# Patient Record
Sex: Male | Born: 1975 | Race: Asian | Hispanic: No | Marital: Married
Health system: Midwestern US, Academic
[De-identification: ages and names within clinical notes are randomized; demographics above are authoritative.]

## PROBLEM LIST (undated history)

## (undated) DIAGNOSIS — G90522 Complex regional pain syndrome I of left lower limb: Secondary | ICD-10-CM

## (undated) DIAGNOSIS — E119 Type 2 diabetes mellitus without complications: Secondary | ICD-10-CM

## (undated) DIAGNOSIS — Z9889 Other specified postprocedural states: Secondary | ICD-10-CM

## (undated) DIAGNOSIS — E785 Hyperlipidemia, unspecified: Secondary | ICD-10-CM

## (undated) DIAGNOSIS — R7303 Prediabetes: Secondary | ICD-10-CM

## (undated) HISTORY — DX: Complex regional pain syndrome i of left lower limb: G90.522

## (undated) HISTORY — DX: Prediabetes: R73.03

## (undated) HISTORY — PX: LEG SURGERY: SHX1003

## (undated) HISTORY — DX: Type 2 diabetes mellitus without complications: E11.9

## (undated) HISTORY — DX: Hyperlipidemia, unspecified: E78.5

## (undated) HISTORY — DX: Other specified postprocedural states: Z98.890

## (undated) HISTORY — PX: ORIF ANKLE FRACTURE: SUR919

---

## 2009-09-29 ENCOUNTER — Inpatient Hospital Stay

## 2012-10-28 DIAGNOSIS — Z9889 Other specified postprocedural states: Secondary | ICD-10-CM

## 2012-10-28 HISTORY — DX: Other specified postprocedural states: Z98.890

## 2014-05-12 ENCOUNTER — Emergency Department: Payer: PRIVATE HEALTH INSURANCE

## 2014-05-12 ENCOUNTER — Inpatient Hospital Stay: Admit: 2014-05-12 | Payer: PRIVATE HEALTH INSURANCE

## 2014-05-12 ENCOUNTER — Emergency Department: Admit: 2014-05-12 | Payer: PRIVATE HEALTH INSURANCE

## 2014-05-12 ENCOUNTER — Inpatient Hospital Stay: Admit: 2014-05-12

## 2014-05-12 ENCOUNTER — Inpatient Hospital Stay
Admission: EM | Admit: 2014-05-12 | Discharge: 2014-05-14 | Disposition: A | Discharge status: Home / Self Care | Payer: PRIVATE HEALTH INSURANCE

## 2014-05-12 DIAGNOSIS — S82899B Other fracture of unspecified lower leg, initial encounter for open fracture type I or II: Secondary | ICD-10-CM

## 2014-05-12 LAB — VENOUS BLOOD GAS, LINE/SYRINGE
%HBO2-Line Draw: 79.9 % — ABNORMAL HIGH (ref 40.0–70.0)
Base Excess-Line Draw: 2.3 mmol/L (ref <=3.0)
CO2 Content-Line Draw: 29 mmol/L (ref 25–29)
Carboxyhgb-Line Draw: 4.1 % — ABNORMAL HIGH (ref 0.0–2.0)
HCO3-Line Draw: 28 mmol/L (ref 24–28)
Methemoglobin-Line Draw: 0.8 % (ref 0.0–1.5)
PCO2-Line Draw: 48 mm[Hg] (ref 41–51)
PH-Line Draw: 7.38 (ref 7.32–7.42)
PO2-Line Draw: 46 mm[Hg] — ABNORMAL HIGH (ref 25–40)
Reduced Hemoglobin-Line Draw: 15.2 % — ABNORMAL HIGH (ref 0.0–5.0)

## 2014-05-12 LAB — BASIC METABOLIC PANEL
Anion Gap: 4 mmol/L (ref 3–16)
BUN: 14 mg/dL (ref 7–25)
CO2: 30 mmol/L (ref 21–33)
Calcium: 9.2 mg/dL (ref 8.6–10.3)
Chloride: 104 mmol/L
Creatinine: 1.2 mg/dL (ref 0.60–1.30)
Glucose: 140 mg/dL — ABNORMAL HIGH (ref 70–100)
Osmolality, Calculated: 289 mosm/kg (ref 278–305)
Potassium: 3.9 mmol/L (ref 3.5–5.3)
Sodium: 138 mmol/L (ref 133–146)

## 2014-05-12 LAB — CBC
Hematocrit: 44.2 %
Hemoglobin: 15.3 g/dL
MCH: 30.8 pg
MCHC: 34.6 g/dL
MCV: 89.1 fL
MPV: 7.5 fL
Platelets: 255 10*3/uL
RBC: 4.97 10*6/uL
RDW: 12.3 %
WBC: 9.1 10*3/uL

## 2014-05-12 LAB — PROTIME-INR
INR: 1 (ref 0.9–1.1)
Protime: 12.9 s (ref 11.6–14.4)

## 2014-05-12 LAB — ABO/RH: Rh Type: POSITIVE

## 2014-05-12 LAB — ANTIBODY SCREEN: Antibody Screen: NEGATIVE

## 2014-05-12 LAB — LACTIC ACID: Lactate: 1.7 mmol/L (ref 0.5–2.2)

## 2014-05-12 LAB — ETHANOL, SERUM: Ethanol: <10 mg/dL (ref 0–10)

## 2014-05-12 MED ORDER — famotidine (PF) (PEPCID) 20 mg/2 mL Soln
20 | INTRAVENOUS | Status: AC | PRN
Start: 2014-05-12 — End: 2014-05-12
  Administered 2014-05-12: 21:00:00 20 via INTRAVENOUS

## 2014-05-12 MED ORDER — succinylcholine (QUELICIN) injection
20 | INTRAMUSCULAR | Status: AC | PRN
Start: 2014-05-12 — End: 2014-05-12
  Administered 2014-05-12: 22:00:00 100 via INTRAVENOUS

## 2014-05-12 MED ORDER — promethazine (PHENERGAN) injection 6.25 mg
25 | Freq: Four times a day (QID) | INTRAMUSCULAR | Status: AC | PRN
Start: 2014-05-12 — End: 2014-05-12

## 2014-05-12 MED ORDER — fentaNYL (SUBLIMAZE) injection 25 mcg
50 | INTRAMUSCULAR | Status: AC | PRN
Start: 2014-05-12 — End: 2014-05-12

## 2014-05-12 MED ORDER — oxyCODONE (ROXICODONE) immediate release tablet 15 mg
15 | ORAL | Status: AC | PRN
Start: 2014-05-12 — End: 2014-05-13
  Administered 2014-05-13 (×3): 15 mg via ORAL

## 2014-05-12 MED ORDER — ceFAZolin (ANCEF) injection
1 | INTRAMUSCULAR | Status: AC
Start: 2014-05-12 — End: ?

## 2014-05-12 MED ORDER — nalOXone (NARCAN) injection 0.04 mg
0.4 | INTRAMUSCULAR | Status: AC | PRN
Start: 2014-05-12 — End: 2014-05-12

## 2014-05-12 MED ORDER — dexamethasone (DECADRON) injection
4 | INTRAMUSCULAR | Status: AC | PRN
Start: 2014-05-12 — End: 2014-05-12
  Administered 2014-05-12: 10 via INTRAVENOUS

## 2014-05-12 MED ORDER — sodium chloride, irrigation 0.9 % irrigation
0.9 | Status: AC | PRN
Start: 2014-05-12 — End: 2014-05-12
  Administered 2014-05-12: 22:00:00 3000
  Administered 2014-05-12: 22:00:00 1000

## 2014-05-12 MED ORDER — lactated ringers infusion
INTRAVENOUS | Status: AC
Start: 2014-05-12 — End: ?

## 2014-05-12 MED ORDER — bacitracin 50,000 Units in sodium chloride, irrigation 0.9 % 3,000 mL IRRIGATION
50000 | INTRAMUSCULAR | Status: AC | PRN
Start: 2014-05-12 — End: 2014-05-12
  Administered 2014-05-12: 22:00:00 50000 [IU]

## 2014-05-12 MED ORDER — oxyCODONE (ROXICODONE) immediate release tablet 10 mg
5 | ORAL | Status: AC | PRN
Start: 2014-05-12 — End: 2014-05-13

## 2014-05-12 MED ORDER — fentaNYL (SUBLIMAZE) 50 mcg/mL injection
50 | INTRAMUSCULAR | Status: AC
Start: 2014-05-12 — End: ?

## 2014-05-12 MED ORDER — HYDROmorphone (DILAUDID) injection Syrg 0.6 mg
1 | INTRAMUSCULAR | Status: AC | PRN
Start: 2014-05-12 — End: 2014-05-12
  Administered 2014-05-13: 01:00:00 0.6 mg via INTRAVENOUS

## 2014-05-12 MED ORDER — lactated ringers infusion
INTRAVENOUS | Status: AC
Start: 2014-05-12 — End: 2014-05-13

## 2014-05-12 MED ORDER — acetaminophen (OFIRMEV) 1,000 mg/100 mL (10 mg/mL) Soln
1000 | INTRAVENOUS | Status: AC
Start: 2014-05-12 — End: ?

## 2014-05-12 MED ORDER — ondansetron (ZOFRAN) 4 mg/2 mL injection
4 | INTRAMUSCULAR | Status: AC | PRN
Start: 2014-05-12 — End: 2014-05-12
  Administered 2014-05-12: 4 via INTRAVENOUS

## 2014-05-12 MED ORDER — HYDROmorphone (DILAUDID) 2 mg/mL injection Syrg
2 | INTRAMUSCULAR | Status: AC
Start: 2014-05-12 — End: ?

## 2014-05-12 MED ORDER — ketorolac (TORADOL) injection 30 mg
30 | INTRAMUSCULAR | Status: AC | PRN
Start: 2014-05-12 — End: 2014-05-12
  Administered 2014-05-12: 30 via INTRAVENOUS

## 2014-05-12 MED ORDER — enoxaparin (LOVENOX) syringe 30 mg/0.3 mL
30 | Freq: Two times a day (BID) | SUBCUTANEOUS | Status: AC
Start: 2014-05-12 — End: 2014-05-14
  Administered 2014-05-13 – 2014-05-14 (×4): 30 mg via SUBCUTANEOUS

## 2014-05-12 MED ORDER — HYDROmorphone (DILAUDID) injection Syrg
2 | INTRAMUSCULAR | Status: AC | PRN
Start: 2014-05-12 — End: 2014-05-12
  Administered 2014-05-12: 23:00:00 1 via INTRAVENOUS
  Administered 2014-05-12 (×2): .5 via INTRAVENOUS

## 2014-05-12 MED ORDER — acetaminophenTYLENOLtablet975mg
325 | Freq: Three times a day (TID) | ORAL | Status: AC
Start: 2014-05-12 — End: 2014-05-13
  Administered 2014-05-13 (×2): 975 mg via ORAL

## 2014-05-12 MED ORDER — famotidine (PF) (PEPCID) 20 mg/2 mL Soln
20 | INTRAVENOUS | Status: AC
Start: 2014-05-12 — End: ?

## 2014-05-12 MED ORDER — fentaNYL (SUBLIMAZE) injection 50 mcg
50 | Freq: Once | INTRAMUSCULAR | Status: AC
Start: 2014-05-12 — End: 2014-05-14

## 2014-05-12 MED ORDER — ceFAZolin (ANCEF) IVPB 1 g in D5W (duplex)
1 | Freq: Once | INTRAVENOUS | Status: AC
Start: 2014-05-12 — End: 2014-05-12
  Administered 2014-05-12: 22:00:00 1 g via INTRAVENOUS

## 2014-05-12 MED ORDER — ondansetron (ZOFRAN) 4 mg/2 mL injection 4 mg
4 | Freq: Three times a day (TID) | INTRAMUSCULAR | Status: AC | PRN
Start: 2014-05-12 — End: 2014-05-12

## 2014-05-12 MED ORDER — ondansetron (ZOFRAN) 4 mg/2 mL injection 4 mg
4 | Freq: Four times a day (QID) | INTRAMUSCULAR | Status: AC | PRN
Start: 2014-05-12 — End: 2014-05-14
  Administered 2014-05-13: 04:00:00 4 mg via INTRAVENOUS

## 2014-05-12 MED ORDER — Diphth Pertus AC Tetanus Vaccine
2 | Freq: Once | INTRAMUSCULAR | Status: AC
Start: 2014-05-12 — End: 2014-05-14

## 2014-05-12 MED ORDER — HYDROmorphone (DILAUDID) 1 mg/mL injection Syrg
1 | INTRAMUSCULAR | Status: AC
Start: 2014-05-12 — End: 2014-05-13

## 2014-05-12 MED ORDER — oxyCODONE (ROXICODONE) immediate release tablet 5 mg
5 | ORAL | Status: AC | PRN
Start: 2014-05-12 — End: 2014-05-13

## 2014-05-12 MED ORDER — ceFAZolin (ANCEF) 1,000 mg in sodium chloride 100 mL IVPB
1 | Freq: Once | INTRAVENOUS | Status: AC
Start: 2014-05-12 — End: 2014-05-12

## 2014-05-12 MED ORDER — lidocaine (PF) 20 mg/mL (2 %) Soln
20 | INTRAVENOUS | Status: AC | PRN
Start: 2014-05-12 — End: 2014-05-12
  Administered 2014-05-12: 21:00:00 60 via INTRAVENOUS

## 2014-05-12 MED ORDER — fentaNYL (SUBLIMAZE) injection 12.5 mcg
50 | INTRAMUSCULAR | Status: AC | PRN
Start: 2014-05-12 — End: 2014-05-12

## 2014-05-12 MED ORDER — bacitracin 50,000 unit injection
50000 | INTRAMUSCULAR | Status: AC
Start: 2014-05-12 — End: 2014-05-13

## 2014-05-12 MED ORDER — HYDROmorphone (DILAUDID) injection Syrg 1 mg
1 | INTRAMUSCULAR | Status: AC | PRN
Start: 2014-05-12 — End: 2014-05-14
  Administered 2014-05-13 – 2014-05-14 (×5): 1 mg via INTRAVENOUS

## 2014-05-12 MED ORDER — propofol10mgmlDIPRIVANinjection
10 | INTRAVENOUS | Status: AC | PRN
Start: 2014-05-12 — End: 2014-05-12
  Administered 2014-05-12: 22:00:00 200 via INTRAVENOUS

## 2014-05-12 MED ORDER — acetaminophen (OFIRMEV) Soln
1000 | INTRAVENOUS | Status: AC | PRN
Start: 2014-05-12 — End: 2014-05-12
  Administered 2014-05-12: 1000 via INTRAVENOUS

## 2014-05-12 MED ORDER — HYDROmorphone (DILAUDID) injection Syrg 0.4 mg
0.5 | INTRAMUSCULAR | Status: AC | PRN
Start: 2014-05-12 — End: 2014-05-12
  Administered 2014-05-13: 01:00:00 0.4 mg via INTRAVENOUS
  Administered 2014-05-13: 01:00:00 0.5 mg via INTRAVENOUS

## 2014-05-12 MED ORDER — lactated ringers infusion
INTRAVENOUS | Status: AC
Start: 2014-05-12 — End: 2014-05-13
  Administered 2014-05-12 (×2): via INTRAVENOUS
  Administered 2014-05-13: 01:00:00 125 mL/h via INTRAVENOUS

## 2014-05-12 MED ORDER — fentaNYL (SUBLIMAZE) 50 mcg/mL injection
50 | INTRAMUSCULAR | Status: AC
Start: 2014-05-12 — End: 2014-05-13

## 2014-05-12 MED ORDER — fentaNYL (SUBLIMAZE) injection 50 mcg
50 | INTRAMUSCULAR | Status: AC | PRN
Start: 2014-05-12 — End: 2014-05-12

## 2014-05-12 MED ORDER — senna-docusate (SENNA-S) 8.6-50 mg per tablet 1 tablet
8.6-50 | Freq: Two times a day (BID) | ORAL | Status: AC
Start: 2014-05-12 — End: 2014-05-14
  Administered 2014-05-13 – 2014-05-14 (×4): 1 via ORAL

## 2014-05-12 MED ORDER — HYDROmorphone (DILAUDID) injection Syrg 0.2 mg
0.5 | INTRAMUSCULAR | Status: AC | PRN
Start: 2014-05-12 — End: 2014-05-12

## 2014-05-12 MED ORDER — lidocaine (PF) 20 mg/mL (2 %) Soln
20 | INTRAVENOUS | Status: AC
Start: 2014-05-12 — End: ?

## 2014-05-12 MED ORDER — fentaNYL (SUBLIMAZE) injection
50 | INTRAMUSCULAR | Status: AC | PRN
Start: 2014-05-12 — End: 2014-05-12
  Administered 2014-05-12: 22:00:00 50 via INTRAVENOUS
  Administered 2014-05-12: 21:00:00 25 via INTRAVENOUS
  Administered 2014-05-12 (×2): 50 via INTRAVENOUS
  Administered 2014-05-12: 21:00:00 25 via INTRAVENOUS
  Administered 2014-05-12: 22:00:00 50 via INTRAVENOUS

## 2014-05-12 MED ORDER — lactated ringers infusion
INTRAVENOUS | Status: AC
Start: 2014-05-12 — End: 2014-05-13
  Administered 2014-05-13: 09:00:00 75 mL/h via INTRAVENOUS

## 2014-05-12 MED ORDER — HYDROmorphone (DILAUDID) injection Syrg 0.5 mg
0.5 | INTRAMUSCULAR | Status: AC | PRN
Start: 2014-05-12 — End: 2014-05-14

## 2014-05-12 MED FILL — LOVENOX 30 MG/0.3 ML SUBCUTANEOUS SYRINGE: 30 30 mg/0.3 mL | SUBCUTANEOUS | Qty: 0.3

## 2014-05-12 MED FILL — HYDROMORPHONE 1 MG/ML INJECTION SYRINGE: 1 1 mg/mL | INTRAMUSCULAR | Qty: 1

## 2014-05-12 MED FILL — LACTATED RINGERS INTRAVENOUS SOLUTION: 125.00 125.00 mL/hr | INTRAVENOUS | Qty: 1000

## 2014-05-12 MED FILL — CEFAZOLIN 1 GRAM SOLUTION FOR INJECTION: 1 1 g | INTRAMUSCULAR | Qty: 1000

## 2014-05-12 MED FILL — HYDROMORPHONE 2 MG/ML INJECTION SYRINGE: 2 2 mg/mL | INTRAMUSCULAR | Qty: 1

## 2014-05-12 MED FILL — ADACEL (TDAP ADOLESN/ADULT)(PF)2 LF-(2.5-5-3-5)-5 LF/0.5 ML IM SYRINGE: 2 2 Lf-(2.5-5-3-5 mcg)-5Lf/0.5 mL | INTRAMUSCULAR | Qty: 0.5

## 2014-05-12 MED FILL — HYDROMORPHONE 0.5 MG/0.5 ML INJECTION SYRINGE: 0.5 0.5 mg/0.5 mL | INTRAMUSCULAR | Qty: 0.5

## 2014-05-12 MED FILL — FAMOTIDINE (PF) 20 MG/2 ML INTRAVENOUS SOLUTION: 20 20 mg/2 mL | INTRAVENOUS | Qty: 2

## 2014-05-12 MED FILL — LIDOCAINE (PF) 20 MG/ML (2 %) INTRAVENOUS SOLUTION: 20 20 mg/mL (2 %) | INTRAVENOUS | Qty: 5

## 2014-05-12 MED FILL — OXYCODONE 15 MG TABLET: 15 15 MG | ORAL | Qty: 1

## 2014-05-12 MED FILL — OFIRMEV 1,000 MG/100 ML (10 MG/ML) INTRAVENOUS SOLUTION: 1000 1,000 mg/100 mL (10 mg/mL) | INTRAVENOUS | Qty: 100

## 2014-05-12 MED FILL — ENOXAPARIN 30 MG/0.3 ML SUBCUTANEOUS SYRINGE: 30 30 mg/0.3 mL | SUBCUTANEOUS | Qty: 0.3

## 2014-05-12 MED FILL — SENNA-S 8.6 MG-50 MG TABLET: 8.6-50 8.6-50 mg | ORAL | Qty: 1

## 2014-05-12 MED FILL — TYLENOL 325 MG TABLET: 325 325 mg | ORAL | Qty: 3

## 2014-05-12 MED FILL — FENTANYL (PF) 50 MCG/ML INJECTION SOLUTION: 50 50 mcg/mL | INTRAMUSCULAR | Qty: 5

## 2014-05-12 MED FILL — LACTATED RINGERS INTRAVENOUS SOLUTION: INTRAVENOUS | Qty: 1000

## 2014-05-12 MED FILL — ONDANSETRON HCL (PF) 4 MG/2 ML INJECTION SOLUTION: 4 4 mg/2 mL | INTRAMUSCULAR | Qty: 2

## 2014-05-12 MED FILL — FENTANYL (PF) 50 MCG/ML INJECTION SOLUTION: 50 50 mcg/mL | INTRAMUSCULAR | Qty: 2

## 2014-05-12 MED FILL — BACITRACIN 50,000 UNIT INTRAMUSCULAR SOLUTION: 50000 50,000 unit | INTRAMUSCULAR | Qty: 1

## 2014-05-12 MED FILL — CEFAZOLIN 1 GRAM/50 ML IN DEXTROSE (ISO-OSMOTIC) INTRAVENOUS PIGGYBACK: 1 1 gram/50 mL | INTRAVENOUS | Qty: 50

## 2014-05-12 NOTE — Unmapped (Signed)
ANESTHESIOLOGY PRE-PROCEDURAL EVALUATION    Austin Williams is a 38 y.o. year old [place] presenting for:    Procedure(s):  IM NAILING TIBIA STYKER    Surgeon:   Gayla Medicus, MD    Anesthesia Evaluation         No history of anesthetic complications   I have reviewed the History and Physical Exam, any relevant changes are noted in the anesthesia pre-operative evaluation.    Medical History / Review of Systems:    Cardiovascular:    Exercise tolerance: good  Duke Met score: 4 - Raking leaves. Weeding or pushing a power mower.  (-) pacemaker, hypertension, valvular problems/murmurs, past MI, CAD, cardiomyopathy, CABG/stent, dysrhythmias, angina, CHF.    Neuro/Muscoloskeletal/Psych:    (+) neuromuscular disease.    (-) seizures, TIA, CVA, headaches, psychiatric history, arthritis, back problems.     Pulmonary:      (-) no pneumonia, COPD, asthma, recent URI, sleep apnea.       GI/Hepatic/Renal:    GERD is well controlled.    (-) hepatitis, liver disease, renal disease, no end stage liver disease.    Endo/Other:        (-) diabetes mellitus, hypothyroidism, hyperthyroidism, no bleeding disorder. No opioid use      PAST MEDICAL HISTORY:  History reviewed. No pertinent past medical history.    PAST SURGICAL HISTORY:  History reviewed. No pertinent past surgical history.    FAMILY HISTORY:  No family history on file.    SOCIAL HISTORY:  History     Social History   ??? Marital Status: Married     Spouse Name: N/A     Number of Children: N/A   ??? Years of Education: N/A     Occupational History   ??? Not on file.     Social History Main Topics   ??? Smoking status: Current Every Day Smoker     Types: Cigarettes   ??? Smokeless tobacco: Not on file   ??? Alcohol Use: No   ??? Drug Use: No   ??? Sexual Activity: Not on file     Other Topics Concern   ??? Not on file     Social History Narrative   ??? No narrative on file       MEDICATIONS:  No current facility-administered medications on file prior to encounter.     No current  outpatient prescriptions on file prior to encounter.       ALLERGIES:  No Known Allergies    VITAL SIGNS:  Wt Readings from Last 3 Encounters:   No data found for Wt     Ht Readings from Last 3 Encounters:   No data found for Ht     Temp Readings from Last 3 Encounters:   No data found for Temp     BP Readings from Last 3 Encounters:   No data found for BP     Pulse Readings from Last 3 Encounters:   No data found for Pulse     SpO2 Readings from Last 3 Encounters:   No data found for SpO2       Physical Exam:    Airway:     Mallampati: II  Mouth Opening: >2 FB  TM distance: > = 3 FB  Neck ROM: full  (-) no facial hair, neck not short      Dental:        Comment: Dentition otherwise ok      Pulmonary:   - normal  exam    Breath sounds clear to auscultation.       Cardiovascular:  - normal exam   Rhythm: regular  Rate: normal    Neuro/Musculoskeletal/Psych:    Mental status: alert and oriented to person, place and time.          Abdominal:     Not obese.  Abdomen: soft.    Current OB Status:       Other Findings:        LAB RESULTS:  Lab Results   Component Value Date    WBC 9.1 05/12/2014    HGB 15.3 05/12/2014    HCT 44.2 05/12/2014    MCV 89.1 05/12/2014    PLT 255 05/12/2014       No results found for this basename: ABORH       Lab Results   Component Value Date    GLUCOSE 140* 05/12/2014    BUN 14 05/12/2014    CO2 30 05/12/2014    CREATININE 1.20 05/12/2014    K 3.9 05/12/2014    NA 138 05/12/2014    CL 104 05/12/2014    CALCIUM 9.2 05/12/2014       Lab Results   Component Value Date    INR 1.0 05/12/2014       No results found for this basename: PREGTESTUR, PREGSERUM, HCG, HCGQUANT       Anesthesia Plan:    ASA 1 - emergency     Anesthesia Type:  general endotracheal.     Intravenous and rapid sequence induction.    Anesthetic plan and risks discussed with patient.    Plan, alternatives, and risks of anesthesia, including death, have been explained to and discussed with the patient/legal guardian.  By my assessment, the  patient/legal guardian understands and agrees.  Scenario presented in detail.  Questions answered.    Use of blood products discussed with patient whom consented to blood products.   Plan discussed with CRNA.

## 2014-05-12 NOTE — Unmapped (Signed)
ANESTHESIOLOGY PRE-PROCEDURAL EVALUATION    Austin Williams is a 38 y.o. year old [place] presenting for:    Procedure(s):  IM NAILING TIBIA STYKER    Surgeon:   Christopher Utz, MD    Anesthesia Evaluation         No history of anesthetic complications   I have reviewed the History and Physical Exam, any relevant changes are noted in the anesthesia pre-operative evaluation.    Medical History / Review of Systems:    Cardiovascular:    Exercise tolerance: good  Duke Met score: 4 - Raking leaves. Weeding or pushing a power mower.  (-) pacemaker, hypertension, valvular problems/murmurs, past MI, CAD, cardiomyopathy, CABG/stent, dysrhythmias, angina, CHF.    Neuro/Muscoloskeletal/Psych:    (+) neuromuscular disease.    (-) seizures, TIA, CVA, headaches, psychiatric history, arthritis, back problems.     Pulmonary:      (-) no pneumonia, COPD, asthma, recent URI, sleep apnea.       GI/Hepatic/Renal:    GERD is well controlled.    (-) hepatitis, liver disease, renal disease, no end stage liver disease.    Endo/Other:        (-) diabetes mellitus, hypothyroidism, hyperthyroidism, no bleeding disorder. No opioid use      PAST MEDICAL HISTORY:  History reviewed. No pertinent past medical history.    PAST SURGICAL HISTORY:  History reviewed. No pertinent past surgical history.    FAMILY HISTORY:  No family history on file.    SOCIAL HISTORY:  History     Social History   ??? Marital Status: Married     Spouse Name: N/A     Number of Children: N/A   ??? Years of Education: N/A     Occupational History   ??? Not on file.     Social History Main Topics   ??? Smoking status: Current Every Day Smoker     Types: Cigarettes   ??? Smokeless tobacco: Not on file   ??? Alcohol Use: No   ??? Drug Use: No   ??? Sexual Activity: Not on file     Other Topics Concern   ??? Not on file     Social History Narrative   ??? No narrative on file       MEDICATIONS:  No current facility-administered medications on file prior to encounter.     No current  outpatient prescriptions on file prior to encounter.       ALLERGIES:  No Known Allergies    VITAL SIGNS:  Wt Readings from Last 3 Encounters:   No data found for Wt     Ht Readings from Last 3 Encounters:   No data found for Ht     Temp Readings from Last 3 Encounters:   No data found for Temp     BP Readings from Last 3 Encounters:   No data found for BP     Pulse Readings from Last 3 Encounters:   No data found for Pulse     SpO2 Readings from Last 3 Encounters:   No data found for SpO2       Physical Exam:    Airway:     Mallampati: II  Mouth Opening: >2 FB  TM distance: > = 3 FB  Neck ROM: full  (-) no facial hair, neck not short      Dental:        Comment: Dentition otherwise ok      Pulmonary:   - normal   exam    Breath sounds clear to auscultation.       Cardiovascular:  - normal exam   Rhythm: regular  Rate: normal    Neuro/Musculoskeletal/Psych:    Mental status: alert and oriented to person, place and time.          Abdominal:     Not obese.  Abdomen: soft.    Current OB Status:       Other Findings:        LAB RESULTS:  Lab Results   Component Value Date    WBC 9.1 05/12/2014    HGB 15.3 05/12/2014    HCT 44.2 05/12/2014    MCV 89.1 05/12/2014    PLT 255 05/12/2014       No results found for this basename: ABORH       Lab Results   Component Value Date    GLUCOSE 140* 05/12/2014    BUN 14 05/12/2014    CO2 30 05/12/2014    CREATININE 1.20 05/12/2014    K 3.9 05/12/2014    NA 138 05/12/2014    CL 104 05/12/2014    CALCIUM 9.2 05/12/2014       Lab Results   Component Value Date    INR 1.0 05/12/2014       No results found for this basename: PREGTESTUR, PREGSERUM, HCG, HCGQUANT       Anesthesia Plan:    ASA 1 - emergency     Anesthesia Type:  general endotracheal.     Intravenous and rapid sequence induction.    Anesthetic plan and risks discussed with patient.    Plan, alternatives, and risks of anesthesia, including death, have been explained to and discussed with the patient/legal guardian.  By my assessment, the  patient/legal guardian understands and agrees.  Scenario presented in detail.  Questions answered.    Use of blood products discussed with patient whom consented to blood products.   Plan discussed with CRNA.
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
## 2014-05-12 NOTE — Unmapped (Signed)
Anesthesia Post Note    Patient: Austin Williams    Procedure(s) Performed: Procedure(s):  Irrigation & Debridement of open fracture, IM NAILING TIBIA fracture, closure of open wound    Anesthesia type: general endotracheal    Patient location: PACU    Post pain: Adequate analgesia    Post assessment: no apparent anesthetic complications    Last Vitals:   Filed Vitals:    05/12/14 2115   BP: 139/90   Pulse: 110   Temp: 97 ??F (36.1 ??C)   Resp: 23   SpO2: 96%       Post vital signs: stable    Level of consciousness: awake and alert     Complications: None
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
## 2014-05-12 NOTE — Unmapped (Signed)
Anesthesia Transfer of Care Note    Patient: Austin Williams  Procedure(s) Performed: Procedure(s):  Irrigation & Debridement of open fracture, IM NAILING TIBIA fracture, closure of open wound    Patient location: PACU    Post pain: Adequate analgesia    Post assessment: no apparent anesthetic complications    Post vital signs:    Filed Vitals:    05/12/14 2031   BP: 155/94   Pulse: 98   Temp: 96.8 ??F (36 ??C)   Resp: 12   SpO2: 100%       Level of consciousness: awake and alert     Complications: None
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
## 2014-05-13 LAB — CBC
Hematocrit: 37.8 %
Hemoglobin: 13 g/dL
MCH: 30.6 pg
MCHC: 34.4 g/dL
MCV: 89 fL
MPV: 7.6 fL
Platelets: 216 10*3/uL
RBC: 4.25 10*6/uL
RDW: 12 %
WBC: 15.3 10*3/uL

## 2014-05-13 LAB — BASIC METABOLIC PANEL
Anion Gap: 6 mmol/L (ref 3–16)
BUN: 14 mg/dL (ref 7–25)
CO2: 26 mmol/L (ref 21–33)
Calcium: 8.6 mg/dL (ref 8.6–10.3)
Chloride: 101 mmol/L
Creatinine: 0.96 mg/dL (ref 0.60–1.30)
Glucose: 178 mg/dL — ABNORMAL HIGH (ref 70–100)
Osmolality, Calculated: 281 mosm/kg (ref 278–305)
Potassium: 4.2 mmol/L (ref 3.5–5.3)
Sodium: 133 mmol/L (ref 133–146)

## 2014-05-13 LAB — URINE DRUG SCREEN WITHOUT CONFIRMATION, STAT
Amphetamine, 500 ng/mL Cutoff: NEGATIVE
Barbiturates UR, 300  ng/mL Cutoff: NEGATIVE
Benzodiazepines UR, 300 ng/mL Cutoff: NEGATIVE
Cocaine UR, 300 ng/mL Cutoff: NEGATIVE
MDMA, 500 ng/mL Cutoff: NEGATIVE
Methadone, UR, 300 ng/mL Cutoff: NEGATIVE
Opiates UR, 300 ng/mL Cutoff: NEGATIVE
Oxycodone, 100 ng/mL Cutoff: POSITIVE — AB
THC UR, 50 ng/mL Cutoff: NEGATIVE
Tricyclic Antidepressants, 300 ng/mL Cutoff: NEGATIVE

## 2014-05-13 MED ORDER — oxyCODONE-acetaminophen (PERCOCET) 5-325 mg per tablet 1-2 tablet
5-325 | ORAL | Status: AC | PRN
Start: 2014-05-13 — End: 2014-05-13

## 2014-05-13 MED ORDER — nicotine (NICODERM CQ) 14 mg/24 hr 1 patch
14 | Freq: Every day | TRANSDERMAL | Status: AC
Start: 2014-05-13 — End: 2014-05-14
  Administered 2014-05-13 – 2014-05-14 (×2): 1 via TRANSDERMAL

## 2014-05-13 MED ORDER — oxyCODONE-acetaminophen (PERCOCET) 5-325 mg per tablet 2 tablet
5-325 | ORAL | Status: AC | PRN
Start: 2014-05-13 — End: 2014-05-14
  Administered 2014-05-13 – 2014-05-14 (×5): 2 via ORAL

## 2014-05-13 MED ORDER — oxyCODONE-acetaminophen (PERCOCET) 5-325 mg per tablet
5-325 | ORAL_TABLET | ORAL | Status: AC | PRN
Start: 2014-05-13 — End: 2014-05-14

## 2014-05-13 MED ORDER — senna-docusate (SENNA-S) 8.6-50 mg per tablet
8.6-50 | ORAL_TABLET | Freq: Two times a day (BID) | ORAL | Status: AC
Start: 2014-05-13 — End: ?

## 2014-05-13 MED ORDER — calcium carbonate (TUMS) chewable tablet 500 mg
200 | Freq: Three times a day (TID) | ORAL | Status: AC | PRN
Start: 2014-05-13 — End: 2014-05-14
  Administered 2014-05-13 – 2014-05-14 (×2): 500 mg via ORAL

## 2014-05-13 MED ORDER — oxyCODONE-acetaminophen (PERCOCET) 5-325 mg per tablet 1 tablet
5-325 | ORAL | Status: AC | PRN
Start: 2014-05-13 — End: 2014-05-14

## 2014-05-13 MED FILL — OXYCODONE-ACETAMINOPHEN 5 MG-325 MG TABLET: 5-325 5-325 mg | ORAL | Qty: 2

## 2014-05-13 MED FILL — LOVENOX 30 MG/0.3 ML SUBCUTANEOUS SYRINGE: 30 30 mg/0.3 mL | SUBCUTANEOUS | Qty: 0.3

## 2014-05-13 MED FILL — LACTATED RINGERS INTRAVENOUS SOLUTION: 75.00 75.00 mL/hr | INTRAVENOUS | Qty: 1000

## 2014-05-13 MED FILL — SENNA-S 8.6 MG-50 MG TABLET: 8.6-50 8.6-50 mg | ORAL | Qty: 1

## 2014-05-13 MED FILL — CALCIUM CARBONATE 200 MG CALCIUM (500 MG) CHEWABLE TABLET: 200 200 mg calcium (500 mg) | ORAL | Qty: 1

## 2014-05-13 MED FILL — NICOTINE 14 MG/24 HR DAILY TRANSDERMAL PATCH: 14 14 mg/24 hr | TRANSDERMAL | Qty: 1

## 2014-05-13 MED FILL — TYLENOL 325 MG TABLET: 325 325 mg | ORAL | Qty: 3

## 2014-05-13 MED FILL — OXYCODONE 15 MG TABLET: 15 15 MG | ORAL | Qty: 1
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
## 2014-05-13 NOTE — Unmapped (Signed)
Problem: Pain  Goal: Patient???s pain is progressing toward patient???s stated pain goal  Assess and monitor patient???s pain using appropriate pain scale. Collaborate with interdisciplinary team and initiate plan and interventions as ordered. Re-assess patient???s pain level 30 - 60 minutes after pain management intervention.   Outcome: Progressing  Pt working with PT, tolerating activity well. Pt able to make needs known and wife at bedside assisting with care. Hourly rounding done, will continue to monitor. AMY J MOTT

## 2014-05-13 NOTE — Unmapped (Signed)
Problem: Discharge Planning  Goal: Identify discharge needs  Outcome: Progressing  Discharge plan to return home with outpatient PT/OT and support of wife and extended family.
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
## 2014-05-13 NOTE — Unmapped (Signed)
--  Case Management Note--    CM deliver rolling walker to patient room.  Andree Moro would not be able to deliver today or over weekend.    Tedra Coupe RN, BSN  Case Manager  [phone]
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
## 2014-05-13 NOTE — Unmapped (Signed)
Orthopaedic Progress Note    Length of Stay: Day 1     Subjective:  Unknown K Zzzjewett is a 38 y.o.  unknown   S/p IMN L distal tib-fib fx.  Had pain issues overnight, but controlled & tolerable now.  No additional complaints.    Current Medications:  ??? diphth pertus AC tetanus vaccine  0.5 mL Intramuscular Once   ??? enoxaparin  30 mg Subcutaneous Q12H [facility]   ??? fentaNYL  50 mcg Intravenous Once   ??? senna-docusate  1 tablet Oral BID     HYDROmorphone, HYDROmorphone, ondansetron, oxyCODONE-acetaminophen, oxyCODONE-acetaminophen    Labs:  Lab Results   Component Value Date    WBC 15.3 05/13/2014    HGB 13.0 05/13/2014    HCT 37.8 05/13/2014    MCV 89.0 05/13/2014    PLT 216 05/13/2014     Lab Results   Component Value Date    GLUCOSE 178* 05/13/2014    BUN 14 05/13/2014    CREATININE 0.96 05/13/2014    K 4.2 05/13/2014     Lab Results   Component Value Date    INR 1.0 05/12/2014    PROTIME 12.9 05/12/2014       Vitals:  Filed Vitals:    05/12/14 2219 05/12/14 2309 05/13/14 0243 05/13/14 0722   BP:   137/81 143/95   Pulse:   95 106   Temp:   98.8 ??F (37.1 ??C) 98.3 ??F (36.8 ??C)   TempSrc:   Oral Oral   Resp:   20 18   Height: 5' 6 (1.676 m)      Weight: 170 lb (77.111 kg)      SpO2:  94% 96% 98%      .  Physical Exam:  AAOx3, NAD  LLE elevated on pillows  Splinted.  Toes wwp.  Wiggles toes.  SILT dp/sp/t, ?saph    Secondary exam BUE & RLE negative      Assessment:  1. S/p IMN L distal tib-fib fx    Plan:  1. Weight bearing status:  NWB LLE  2. Anticoagulation:  Lovenox, mobilize  3. PT/OT: OOBAT, crutch/walker training  4. Activity: out of bed and ambulate  5. Dispo:  May go home today if comfortable w/ pain & mobilization  6.  F/u Dr. Henreitta Cea 2 weeks    Dr Henreitta Cea has personally seen the patient today and agrees with the plan.    Edison Simon, PA    Staff Addendum:  Patient seen & examined with the PA.  Agree with the note above.    Gayla Medicus, MD

## 2014-05-13 NOTE — Unmapped (Signed)
Problem: Pain  Goal: Patient???s pain is progressing toward patient???s stated pain goal  Assess and monitor patient???s pain using appropriate pain scale. Collaborate with interdisciplinary team and initiate plan and interventions as ordered. Re-assess patient???s pain level 30 - 60 minutes after pain management intervention.   Outcome: Progressing  Patient's pain controlled with PRN oxycodone and dilaudid.  Educated patient to call within increases in pain.

## 2014-05-13 NOTE — Unmapped (Signed)
--  Case Management Note--  PT called and stated pt will for sure need a walker.  Call placed to [facility] [phone] to make aware of order and of possible d/c 7/18.  Stated will deliver to pt room.  Faxed: facesheet, H&P, order with height and weight to [phone].     Tedra Coupe RN, BSN  Case Manager  [phone]
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
## 2014-05-13 NOTE — Unmapped (Signed)
Trauma Attending  I have seen and examined the patient and discussed their care on trauma multidisciplinary rounds with the trauma team. I concur with the NP findings, interpretation of data, and management plan as modified by me above. My revisions are as follows:     Active Problems:    Open fracture of left tibia and fibula    Tertiary exam completed 05/13/2014. No new areas of pain identified. No further injuries identified on review of imaging performed.    L tibia/fibula fx - NWB, s/p IMN per ortho 7/16  PT/OT today and plan for d/c once clears PT      Yolanda Bonine, MD  Trauma Surgeon  Division of Trauma, Surgical Critical Care, and Acute Care Surgery  [facility]  Academic Office [phone]  Trauma Hotline [phone]  For Trauma Transfers, call [phone]
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
## 2014-05-13 NOTE — Unmapped (Signed)
Patient rates pain 8/10, medicated with PRN Oxycodone.  Dressing CDI, ice to area.  Tolerating regular diet.  Denies needs at this time. Will continue to monitor. Call light within reach. [name] Gest C Cordell Coke
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
## 2014-05-14 MED ORDER — HYDROmorphone (DILAUDID) injection Syrg 1 mg
1 | Freq: Once | INTRAMUSCULAR | Status: AC
Start: 2014-05-14 — End: 2014-05-14
  Administered 2014-05-14: 08:00:00 1 mg via INTRAVENOUS

## 2014-05-14 MED ORDER — acetaminophen (TYLENOL) 325 MG tablet
325 | ORAL_TABLET | Freq: Four times a day (QID) | ORAL | 0.00 refills | 11.00000 days | Status: AC
Start: 2014-05-14 — End: ?

## 2014-05-14 MED ORDER — acetaminophen (TYLENOL) tablet 650 mg
325 | Freq: Four times a day (QID) | ORAL | Status: AC
Start: 2014-05-14 — End: 2014-05-14
  Administered 2014-05-14 (×2): 650 mg via ORAL

## 2014-05-14 MED ORDER — oxyCODONE (ROXICODONE) immediate release tablet 5-15 mg
5 | ORAL | Status: AC | PRN
Start: 2014-05-14 — End: 2014-05-14
  Administered 2014-05-14: 14:00:00 10 mg via ORAL
  Administered 2014-05-14: 09:00:00 15 mg via ORAL
  Administered 2014-05-14: 18:00:00 10 mg via ORAL

## 2014-05-14 MED ORDER — oxyCODONE (ROXICODONE) 5 MG immediate release tablet
5 | ORAL_TABLET | ORAL | 0.00 refills | 6.00000 days | Status: AC | PRN
Start: 2014-05-14 — End: 2014-05-27

## 2014-05-14 MED ORDER — polyethylene glycol (MIRALAX) packet 17 g
17 | Freq: Once | ORAL | Status: AC
Start: 2014-05-14 — End: 2014-05-14
  Administered 2014-05-14: 17:00:00 17 g via ORAL

## 2014-05-14 MED FILL — LOVENOX 30 MG/0.3 ML SUBCUTANEOUS SYRINGE: 30 30 mg/0.3 mL | SUBCUTANEOUS | Qty: 0.3

## 2014-05-14 MED FILL — TYLENOL 325 MG TABLET: 325 325 mg | ORAL | Qty: 2

## 2014-05-14 MED FILL — HYDROMORPHONE 1 MG/ML INJECTION SYRINGE: 1 1 mg/mL | INTRAMUSCULAR | Qty: 1

## 2014-05-14 MED FILL — OXYCODONE 5 MG TABLET: 5 5 MG | ORAL | Qty: 2

## 2014-05-14 MED FILL — SENNA-S 8.6 MG-50 MG TABLET: 8.6-50 8.6-50 mg | ORAL | Qty: 1

## 2014-05-14 MED FILL — POLYETHYLENE GLYCOL 3350 17 GRAM ORAL POWDER PACKET: 17 17 gram | ORAL | Qty: 1

## 2014-05-14 MED FILL — OXYCODONE 5 MG TABLET: 5 5 MG | ORAL | Qty: 3

## 2014-05-14 MED FILL — NICOTINE 14 MG/24 HR DAILY TRANSDERMAL PATCH: 14 14 mg/24 hr | TRANSDERMAL | Qty: 1

## 2014-05-14 MED FILL — OXYCODONE-ACETAMINOPHEN 5 MG-325 MG TABLET: 5-325 5-325 mg | ORAL | Qty: 2

## 2014-05-14 NOTE — Unmapped (Signed)
Pt resting quietly with eyes closed on sofa, wife chairside.  Pt transferred to sofa withassistance from wife at approx 0400 and has been lying there since.  L LE elevated on pillow and right LE positioned with pillow to ensure it does not fall off sofa.  Pt has been unable to tolerate bed, stating that he is annoyed by noise from air flow and that the inflation and deflation of the matress causes the pain in his L LE to become intense when the air movement in the matress forces the leg to move.  Numerous staff members have explained that the air movement is designed to alleviate pressure and decrease the incidence of pressure wounds.  Dr. Kathy Breach further stated to the patient that lying on an extremely firm surface such as the sofa is not appropriate for a post surgical patient as himself.

## 2014-05-14 NOTE — Unmapped (Signed)
---   CASE MANAGEMENT NOTE ---  CM contacted by PT that patient now wanting HOME PT.  CM met with patient and wife; [employer] preferred company.  CM faxed referral to [facility].      Emelia Loron RN  Case Manager  Ascom [phone]

## 2014-05-14 NOTE — Unmapped (Signed)
Occupational Therapy  Discharge Report     Patient Identification  Unknown K Zzzjewett is a 38 y.o. male.  DOB:  10/27/1872  Admit Date:  05/12/2014  Discharge date and time: 05/14/2014  4:19 PM   Attending Provider: No att. providers found                                   Admission Diagnoses: Fracture of tibia with fibula, open, left, type I or II, initial encounter [823.92]        Patient discharged from hospital on 05/14/14  from OT    Initial Evaluation completed on 05/13/14.     Met 0 out of 4 goals.    Goals that were not met were due to short hospital stay.      Patient educated about role of OT, safe transfers.    Patient response to education good       See last OT note for current functional status at time if discharge       Liborio Nixon, OTR/L  05/14/2014

## 2014-05-14 NOTE — Unmapped (Signed)
Orthopaedic Progress Note    Length of Stay: Day 2     Subjective:  Unknown Austin Williams is a 38 y.o.  male   S/p IMN L distal tib-fib fx.  Had pain issues overnight, but controlled & tolerable now.  No additional complaints.    Current Medications:  ??? acetaminophen  650 mg Oral Q6H   ??? diphth pertus AC tetanus vaccine  0.5 mL Intramuscular Once   ??? enoxaparin  30 mg Subcutaneous Q12H [facility]   ??? fentaNYL  50 mcg Intravenous Once   ??? nicotine  1 patch Transdermal Daily 0900   ??? senna-docusate  1 tablet Oral BID     calcium carbonate, HYDROmorphone, HYDROmorphone, ondansetron, oxyCODONE    Labs:  Lab Results   Component Value Date    WBC 15.3 05/13/2014    HGB 13.0 05/13/2014    HCT 37.8 05/13/2014    MCV 89.0 05/13/2014    PLT 216 05/13/2014     Lab Results   Component Value Date    GLUCOSE 178* 05/13/2014    BUN 14 05/13/2014    CREATININE 0.96 05/13/2014    Austin 4.2 05/13/2014     Lab Results   Component Value Date    INR 1.0 05/12/2014    PROTIME 12.9 05/12/2014       Vitals:  Filed Vitals:    05/13/14 1116 05/13/14 2020 05/13/14 2315 05/14/14 0816   BP: 145/89 148/103 144/87 120/79   Pulse: 92 91 80 78   Temp: 98.1 ??F (36.7 ??C) 98.4 ??F (36.9 ??C) 97.9 ??F (36.6 ??C) 98.7 ??F (37.1 ??C)   TempSrc: Oral Oral Oral Oral   Resp: 16 16 16 16    Height:       Weight:       SpO2: 94% 100% 98% 98%      .  Physical Exam:  AAOx3, NAD  LLE elevated on pillows in recliner   Splinted.  Toes wwp.  Wiggles toes.  SILT dp/sp/t,     Assessment:  1. S/p IMN L distal tib-fib fx    Plan:  1. Weight bearing status:  NWB LLE  2. Anticoagulation:  Lovenox, mobilize  3. PT/OT: OOBAT, crutch/walker training  4. Activity: out of bed and ambulate  5. Dispo:  May go home today if comfortable w/ pain & mobilization  6.  F/u Dr. Henreitta Cea 2 weeks    I have seen the patient on behalf of Dr Henreitta Cea. I have discussed the plan of treatment with Dr Henreitta Cea and he agrees with the plan.      Lynden Oxford, PA

## 2014-05-14 NOTE — Unmapped (Signed)
D: patient requesting surgical shoe to keep ace wrap clean. Patient verbalized understanding of non-weight bearing of Left lower extremity. No sx of distress.
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
## 2014-05-14 NOTE — Unmapped (Signed)
D: patient's wife taught back to this RN discharge instructions including follow up appointments, new medications doses due and side effects. No sx of acute distress. Patient requesting laxative prior to discharge and home care information.
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
## 2014-05-14 NOTE — Unmapped (Signed)
Orthopaedic Progress Note    Length of Stay: Day 2     Subjective:  Austin Williams is a 38 y.o.  male   S/p IMN L distal tib-fib fx.  Had pain issues overnight, but controlled & tolerable now.  No additional complaints.    Current Medications:  ??? acetaminophen  650 mg Oral Q6H   ??? diphth pertus AC tetanus vaccine  0.5 mL Intramuscular Once   ??? enoxaparin  30 mg Subcutaneous Q12H [facility]   ??? fentaNYL  50 mcg Intravenous Once   ??? nicotine  1 patch Transdermal Daily 0900   ??? senna-docusate  1 tablet Oral BID     calcium carbonate, HYDROmorphone, HYDROmorphone, ondansetron, oxyCODONE    Labs:  Lab Results   Component Value Date    WBC 15.3 05/13/2014    HGB 13.0 05/13/2014    HCT 37.8 05/13/2014    MCV 89.0 05/13/2014    PLT 216 05/13/2014     Lab Results   Component Value Date    GLUCOSE 178* 05/13/2014    BUN 14 05/13/2014    CREATININE 0.96 05/13/2014    K 4.2 05/13/2014     Lab Results   Component Value Date    INR 1.0 05/12/2014    PROTIME 12.9 05/12/2014       Vitals:  Filed Vitals:    05/13/14 1116 05/13/14 2020 05/13/14 2315 05/14/14 0816   BP: 145/89 148/103 144/87 120/79   Pulse: 92 91 80 78   Temp: 98.1 ??F (36.7 ??C) 98.4 ??F (36.9 ??C) 97.9 ??F (36.6 ??C) 98.7 ??F (37.1 ??C)   TempSrc: Oral Oral Oral Oral   Resp: 16 16 16 16    Height:       Weight:       SpO2: 94% 100% 98% 98%      .  Physical Exam:  AAOx3, NAD  LLE elevated on pillows in recliner   Splinted.  Toes wwp.  Wiggles toes.  SILT dp/sp/t,     Assessment:  1. S/p IMN L distal tib-fib fx    Plan:  1. Weight bearing status:  NWB LLE  2. Anticoagulation:  Lovenox, mobilize  3. PT/OT: OOBAT, crutch/walker training  4. Activity: out of bed and ambulate  5. Dispo:  May go home today if comfortable w/ pain & mobilization  6.  F/u Dr. Henreitta Cea 2 weeks    I have seen the patient on behalf of Dr Henreitta Cea. I have discussed the plan of treatment with Dr Henreitta Cea and he agrees with the plan.      Lynden Oxford, PA    Staff Addendum:  Patient discussed with the PA.  Agree with the note  above.    Gayla Medicus, MD

## 2014-05-14 NOTE — Unmapped (Signed)
Several phone calls made to staff in response to patient's numerous and persistent complaints related to his inability to be comfortable in bed.  Phil, charge RN had previously spoken to patient re: limitations of bed when patient complained of being unable to sleep due to the noise made when to bed inflates.  Airflow was disabled and patient subsequently states that bed is now more uncomfortable and pulling on operative limb.  Pt further reports that his pain is out of control.  Pt has oral pain meds ordered q 4 hour and has no IV access.  Order was rec'd that patient may be without IV access after IV was removed this afternoon due to some leakage at site during a therapy session.  Pt did not want to have another access placed.  Call to [facility], clinical lead to address patient's concerns regarding bed and call to Dr. Kathy Breach re: inadequate pain control.  Rec;'d order for one time dose of percocet 5 mg. But did not need as next dose was given approx 10 min early per Dr. Kathy Breach OK.  IV access placed, #20 in the L upper arm by A. Corbett, RN and plan for use of IV pain meds to achieve adequate pain control explained to pt. Pt is currently seated in chair at bedside.

## 2014-05-14 NOTE — Unmapped (Signed)
D: patient discharged to home per w/c x 1 assist.
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
## 2014-05-19 NOTE — Unmapped (Signed)
Pt called requesting a refill for Oxycodone. Would like to pick up at [facility]    Last Filled:05/14/14    Surgery Date:05/12/14    Irrigation & Debridement of open fracture, IM NAILING TIBIA fracture, closure of open wound

## 2014-05-20 NOTE — Unmapped (Signed)
Can [place] write for this please and leave at the front desk.  Thanks!

## 2014-05-20 NOTE — Unmapped (Signed)
Can dr Rubye Oaks write for this please, patient would like to pick up at [facility] before 5pm  Thanks!

## 2014-05-20 NOTE — Unmapped (Signed)
Message copied by Carnella Guadalajara on Fri May 20, 2014  4:07 PM  ------       Message from: [facility]       Created: Fri May 20, 2014  2:57 PM       Regarding: RE: RX refill       Contact: [phone]         Per Dr. Henreitta Cea, he can have 1-2 q 6 # 75.  I think he wants to pick up at [facility].  Can you have someone write for it up there?  Thanks!       ----- Message -----          From: Carnella Guadalajara, MA          Sent: 05/20/2014   2:19 PM            To: Durwin Reges, MA       Subject: RX refill                                                  This patient called requesting a refill of his oxycodone 5mg . He said he called in yesterday but i dont see a phone note. He said he thinks he talked to [place]. He has 3 pills left and would like to pick up at [facility] this afternoon.              Intertrochanteric Hip Fracture, Intramedullary Fixation                Unknown K Zzzjewett (10/27/1872)                                   Date of Surgery-  05/12/2014                     Preoperative Diagnosis-   Left Grade II open tibia/fibula fracture                     Postoperative Diagnosis- Left Grade II open tibia/fibula fracture                     Procedure-  1.  Irrigation and debridement of left open tibia fracture                                 2. Open Reduction, Intramedullary nail  Left tibia fracture hip                           2.  X-ray exam - tibia                                   Surgeon- Epimenio Foot, MD                              ------

## 2014-05-20 NOTE — Unmapped (Signed)
Message copied by Carnella Guadalajara on Fri May 20, 2014  4:02 PM  ------       Message from: [facility]       Created: Fri May 20, 2014  2:57 PM       Regarding: RE: RX refill       Contact: [phone]         Per Dr. Henreitta Cea, he can have 1-2 q 6 # 75.  I think he wants to pick up at [facility].  Can you have someone write for it up there?  Thanks!       ----- Message -----          From: Carnella Guadalajara, MA          Sent: 05/20/2014   2:19 PM            To: Durwin Reges, MA       Subject: RX refill                                                  This patient called requesting a refill of his oxycodone 5mg . He said he called in yesterday but i dont see a phone note. He said he thinks he talked to [place]. He has 3 pills left and would like to pick up at [facility] this afternoon.              Intertrochanteric Hip Fracture, Intramedullary Fixation                Unknown K Zzzjewett (10/27/1872)                                   Date of Surgery-  05/12/2014                     Preoperative Diagnosis-   Left Grade II open tibia/fibula fracture                     Postoperative Diagnosis- Left Grade II open tibia/fibula fracture                     Procedure-  1.  Irrigation and debridement of left open tibia fracture                                 2. Open Reduction, Intramedullary nail  Left tibia fracture hip                           2.  X-ray exam - tibia                                   Surgeon- Epimenio Foot, MD                              ------

## 2014-05-20 NOTE — Unmapped (Signed)
Patient is aware and will pick up at [facility]

## 2014-05-20 NOTE — Unmapped (Signed)
This patient called requesting a refill of his oxycodone 5mg . He said he called in yesterday but i dont see a phone note. He said he thinks he talked to [place]. He has 3 pills left and would like to pick up at [facility] this afternoon.     Intertrochanteric Hip Fracture, Intramedullary Fixation     Austin Williams (10/27/1872)           Date of Surgery- 05/12/2014       Preoperative Diagnosis- Left Grade II open tibia/fibula fracture       Postoperative Diagnosis- Left Grade II open tibia/fibula fracture       Procedure- 1. Irrigation and debridement of left open tibia fracture   2. Open Reduction, Intramedullary nail Left tibia fracture hip   2. X-ray exam - tibia     Surgeon- Epimenio Foot, MD

## 2014-05-20 NOTE — Unmapped (Signed)
Message copied by Carnella Guadalajara on Fri May 20, 2014  4:01 PM  ------       Message from: [facility]       Created: Fri May 20, 2014  2:57 PM       Regarding: RE: RX refill       Contact: [phone]         Per Dr. Henreitta Cea, he can have 1-2 q 6 # 75.  I think he wants to pick up at [facility].  Can you have someone write for it up there?  Thanks!       ----- Message -----          From: Carnella Guadalajara, MA          Sent: 05/20/2014   2:19 PM            To: Durwin Reges, MA       Subject: RX refill                                                  This patient called requesting a refill of his oxycodone 5mg . He said he called in yesterday but i dont see a phone note. He said he thinks he talked to [place]. He has 3 pills left and would like to pick up at [facility] this afternoon.              Intertrochanteric Hip Fracture, Intramedullary Fixation                Unknown K Zzzjewett (10/27/1872)                                   Date of Surgery-  05/12/2014                     Preoperative Diagnosis-   Left Grade II open tibia/fibula fracture                     Postoperative Diagnosis- Left Grade II open tibia/fibula fracture                     Procedure-  1.  Irrigation and debridement of left open tibia fracture                                 2. Open Reduction, Intramedullary nail  Left tibia fracture hip                           2.  X-ray exam - tibia                                   Surgeon- Epimenio Foot, MD                              ------

## 2014-05-27 ENCOUNTER — Inpatient Hospital Stay: Admit: 2014-05-27 | Payer: PRIVATE HEALTH INSURANCE | Attending: Sports Medicine

## 2014-05-27 ENCOUNTER — Ambulatory Visit: Admit: 2014-05-27 | Discharge: 2014-05-27 | Payer: PRIVATE HEALTH INSURANCE | Attending: Sports Medicine

## 2014-05-27 DIAGNOSIS — S82402E Unspecified fracture of shaft of left fibula, subsequent encounter for open fracture type I or II with routine healing: Secondary | ICD-10-CM

## 2014-05-27 DIAGNOSIS — IMO0001 Reserved for inherently not codable concepts without codable children: Secondary | ICD-10-CM

## 2014-05-27 MED ORDER — oxyCODONE-acetaminophen (PERCOCET) 5-325 mg per tablet
5-325 | ORAL_TABLET | ORAL | Status: AC | PRN
Start: 2014-05-27 — End: 2014-06-09

## 2014-05-27 NOTE — Unmapped (Signed)
Post operative bandage was removed per the doctor's instruction.   Patient tolerated this well with no complications. Patient was given instructions regarding showering/bathing.    Brace (boot) was applied per the doctor's instruction.   Patient tolerated this well with no complications.  Patient was given instructions regarding showering/bathing, brace usage.

## 2014-05-27 NOTE — Unmapped (Signed)
Sutures were removed per the doctor's instruction.   Patient tolerated this well with no complications.   Steri strips were applied.   Patient was given instructions regarding showering/bathing.  New dressing and bootwalker applied.

## 2014-05-27 NOTE — Unmapped (Signed)
[facility]  [place] PHYSICIANS    ORTHOPEDIC SURGERY AND SPORTS MEDICINE    Jefrey Raburn  [birth year]    Date of Exam:   05/27/2014    Date of Surgery:  05/12/2014  Surgical Procedure:  I&D and IM nail of left Grade II open tibia/fibula fracture    Zacheriah returns today for the 1st post-operative exam.  The patient is doing well with appropriate complaints of pain.  He has been NWB and in a splint.  He had an oblique fracture of his distal third tibia.  He does state that he has some paresthesias in the distribution of the saphenous nerve, which was seen in his medial wound.     Exam:  The left leg & knee is examined.  The patient???s incisions are healing well without signs of infection.  There is no erythema or drainage.  There is an appropriate knee effusion.  Range of motion is from 0 to 90.  The knee is ligamentously stable to exam today. He has intact sensation to his sural, superficial peroneal, deep peroneal, & tibial nerve distributions.  He has subjective decreased sensation in the saphenous nerve distribution.  He is able to PF/DF/ & fire his EHL.  He has a palpable dp and pt pulse.    Films: Tibia fracture stabilized with IM nail in near anatomic configuration.  Minimally displaced posterior malleolus fracture.  Mild widening of medial clear space    Assesment: Status post I&D and IM nail of grade II open left distal tibia fracture    Plan:  Sutures and staples were removed today.  Patient was placed in a boot and will continue to be NWB due to likely mild deltoid ligament injury.  I would like to see him back in 4 weeks with new x-rays to assess his progress.  We may be able to start a weight bearing protocol at that time.  Everything was explained in detail to the patient who voiced understanding and agreement with the plan.    Gayla Medicus, MD

## 2014-05-27 NOTE — Unmapped (Signed)
OARRS report has been printed and given to doctor to review. Documented in Doc flowsheet.

## 2014-06-08 NOTE — Unmapped (Signed)
pre-op, med. & arrival time (1400) instructions confirmed with pt.

## 2014-06-09 ENCOUNTER — Ambulatory Visit: Admit: 2014-06-09 | Payer: PRIVATE HEALTH INSURANCE

## 2014-06-09 ENCOUNTER — Inpatient Hospital Stay: Admit: 2014-06-09 | Payer: PRIVATE HEALTH INSURANCE

## 2014-06-09 DIAGNOSIS — Z0000 Encounter for general adult medical examination without abnormal findings: Secondary | ICD-10-CM

## 2014-06-09 MED ORDER — ondansetron (ZOFRAN) 4 mg/2 mL injection
4 | INTRAMUSCULAR | Status: AC | PRN
Start: 2014-06-09 — End: 2014-06-09
  Administered 2014-06-10: 4 via INTRAVENOUS

## 2014-06-09 MED ORDER — oxyCODONE (ROXICODONE) immediate release tablet 10 mg
5 | Freq: Once | ORAL | Status: AC
Start: 2014-06-09 — End: 2014-06-09
  Administered 2014-06-10: 03:00:00 10 mg via ORAL

## 2014-06-09 MED ORDER — peppermint oil liquid 1 mL
Status: AC | PRN
Start: 2014-06-09 — End: 2014-06-10

## 2014-06-09 MED ORDER — fentaNYLSUBLIMAZEinjection125mcg
50 | INTRAMUSCULAR | Status: AC | PRN
Start: 2014-06-09 — End: 2014-06-10
  Administered 2014-06-10 (×4): 12.5 ug via INTRAVENOUS

## 2014-06-09 MED ORDER — HYDROmorphone (DILAUDID) injection Syrg 0.4 mg
1 | INTRAMUSCULAR | Status: AC | PRN
Start: 2014-06-09 — End: 2014-06-10
  Administered 2014-06-10: 01:00:00 0.4 mg via INTRAVENOUS

## 2014-06-09 MED ORDER — lactated ringers infusion
INTRAVENOUS | Status: AC | PRN
Start: 2014-06-09 — End: 2014-06-09
  Administered 2014-06-09: 22:00:00 via INTRAVENOUS

## 2014-06-09 MED ORDER — atropine injection
0.4 | INTRAMUSCULAR | Status: AC | PRN
Start: 2014-06-09 — End: 2014-06-09
  Administered 2014-06-10: .8 via INTRAVENOUS

## 2014-06-09 MED ORDER — oxyCODONE-acetaminophen (PERCOCET) 5-325 mg per tablet
5-325 | ORAL_TABLET | Freq: Four times a day (QID) | ORAL | Status: AC | PRN
Start: 2014-06-09 — End: 2014-06-09

## 2014-06-09 MED ORDER — diphenhydrAMINE (BENADRYL) capsule 25 mg
25 | Freq: Four times a day (QID) | ORAL | Status: AC | PRN
Start: 2014-06-09 — End: 2014-06-10
  Administered 2014-06-10: 02:00:00 25 mg via ORAL

## 2014-06-09 MED ORDER — sodium chloride 0.9 % infusion
INTRAVENOUS | Status: AC | PRN
Start: 2014-06-09 — End: 2014-06-09
  Administered 2014-06-09: 23:00:00 via INTRAVENOUS

## 2014-06-09 MED ORDER — morphine injection 2 mg
4 | Freq: Once | INTRAMUSCULAR | Status: AC
Start: 2014-06-09 — End: 2014-06-09
  Administered 2014-06-09: 21:00:00 2 mg via INTRAVENOUS

## 2014-06-09 MED ORDER — morphine 2 mg/mL injection Crtg
2 | INTRAVENOUS | Status: AC
Start: 2014-06-09 — End: 2014-06-10

## 2014-06-09 MED ORDER — ondansetron (ZOFRAN) 4 mg/2 mL injection 4 mg
4 | Freq: Three times a day (TID) | INTRAMUSCULAR | Status: AC | PRN
Start: 2014-06-09 — End: 2014-06-10

## 2014-06-09 MED ORDER — ceFAZolin (ANCEF) IVPB 1 g in D5W (duplex)
1 | INTRAVENOUS | Status: AC | PRN
Start: 2014-06-09 — End: 2014-06-09
  Administered 2014-06-09: 23:00:00 1 g via INTRAVENOUS

## 2014-06-09 MED ORDER — midazolam (PF) (VERSED) injection
1 | INTRAMUSCULAR | Status: AC | PRN
Start: 2014-06-09 — End: 2014-06-09
  Administered 2014-06-09: 22:00:00 2 via INTRAVENOUS

## 2014-06-09 MED ORDER — sodium chloride, irrigation 0.9 % irrigation
0.9 | Status: AC | PRN
Start: 2014-06-09 — End: 2014-06-09
  Administered 2014-06-09: 23:00:00 1000

## 2014-06-09 MED ORDER — rocuronium (ZEMURON) injection
10 | INTRAVENOUS | Status: AC | PRN
Start: 2014-06-09 — End: 2014-06-09
  Administered 2014-06-09: 23:00:00 30 via INTRAVENOUS

## 2014-06-09 MED ORDER — fentaNYL (SUBLIMAZE) injection 25 mcg
50 | INTRAMUSCULAR | Status: AC | PRN
Start: 2014-06-09 — End: 2014-06-10
  Administered 2014-06-10 (×2): 25 ug via INTRAVENOUS

## 2014-06-09 MED ORDER — HYDROmorphone (DILAUDID) injection Syrg
2 | INTRAMUSCULAR | Status: AC | PRN
Start: 2014-06-09 — End: 2014-06-09
  Administered 2014-06-09 – 2014-06-10 (×4): .5 via INTRAVENOUS

## 2014-06-09 MED ORDER — propofol 10 mg/ml (DIPRIVAN) injection
10 | INTRAVENOUS | Status: AC | PRN
Start: 2014-06-09 — End: 2014-06-09
  Administered 2014-06-09: 23:00:00 200 via INTRAVENOUS
  Administered 2014-06-10: 01:00:00 40 via INTRAVENOUS

## 2014-06-09 MED ORDER — dexamethasone (DECADRON) injection
4 | INTRAMUSCULAR | Status: AC | PRN
Start: 2014-06-09 — End: 2014-06-09
  Administered 2014-06-09: 23:00:00 4 via INTRAVENOUS

## 2014-06-09 MED ORDER — neostigmine (PROSTIGMINE) injection
1 | INTRAMUSCULAR | Status: AC | PRN
Start: 2014-06-09 — End: 2014-06-09
  Administered 2014-06-10: 3 via INTRAVENOUS

## 2014-06-09 MED ORDER — promethazine (PHENERGAN) injection 6.25 mg
25 | Freq: Four times a day (QID) | INTRAMUSCULAR | Status: AC | PRN
Start: 2014-06-09 — End: 2014-06-10

## 2014-06-09 MED ORDER — fentaNYL (SUBLIMAZE) injection 50 mcg
50 | INTRAMUSCULAR | Status: AC | PRN
Start: 2014-06-09 — End: 2014-06-10
  Administered 2014-06-10: 01:00:00 50 ug via INTRAVENOUS

## 2014-06-09 MED ORDER — lidocaine (PF) 20 mg/mL (2 %) Soln
20 | INTRAVENOUS | Status: AC | PRN
Start: 2014-06-09 — End: 2014-06-09
  Administered 2014-06-09: 23:00:00 100 via INTRAVENOUS

## 2014-06-09 MED ORDER — oxyCODONE-acetaminophen (PERCOCET) 5-325 mg per tablet
5-325 | ORAL_TABLET | Freq: Four times a day (QID) | ORAL | Status: AC | PRN
Start: 2014-06-09 — End: 2014-08-08

## 2014-06-09 MED ORDER — fentaNYL (SUBLIMAZE) injection
50 | INTRAMUSCULAR | Status: AC | PRN
Start: 2014-06-09 — End: 2014-06-09
  Administered 2014-06-09 (×5): 50 via INTRAVENOUS

## 2014-06-09 MED ORDER — HYDROmorphone (DILAUDID) injection Syrg 0.6 mg
1 | INTRAMUSCULAR | Status: AC | PRN
Start: 2014-06-09 — End: 2014-06-10
  Administered 2014-06-10: 01:00:00 0.6 mg via INTRAVENOUS

## 2014-06-09 MED ORDER — nalOXone (NARCAN) injection 0.04 mg
0.4 | INTRAMUSCULAR | Status: AC | PRN
Start: 2014-06-09 — End: 2014-06-10

## 2014-06-09 MED ORDER — HYDROmorphone (DILAUDID) injection Syrg 0.2 mg
1 | INTRAMUSCULAR | Status: AC | PRN
Start: 2014-06-09 — End: 2014-06-10

## 2014-06-09 MED FILL — MORPHINE 4 MG/ML INJECTION SYRINGE: 4 4 mg/mL | INTRAMUSCULAR | Qty: 0.5

## 2014-06-09 MED FILL — DIPHENHYDRAMINE 25 MG CAPSULE: 25 25 mg | ORAL | Qty: 1

## 2014-06-09 MED FILL — OXYCODONE 5 MG TABLET: 5 5 MG | ORAL | Qty: 2

## 2014-06-09 MED FILL — FENTANYL (PF) 50 MCG/ML INJECTION SOLUTION: 50 50 mcg/mL | INTRAMUSCULAR | Qty: 5

## 2014-06-09 MED FILL — HYDROMORPHONE 1 MG/ML INJECTION SYRINGE: 1 1 mg/mL | INTRAMUSCULAR | Qty: 1

## 2014-06-09 MED FILL — CEFAZOLIN 1 GRAM/50 ML IN DEXTROSE (ISO-OSMOTIC) INTRAVENOUS PIGGYBACK: 1 1 gram/50 mL | INTRAVENOUS | Qty: 50

## 2014-06-09 MED FILL — MORPHINE 2 MG/ML INTRAVENOUS CARTRIDGE: 2 2 mg/mL | INTRAVENOUS | Qty: 1

## 2014-06-09 NOTE — Unmapped (Signed)
Anesthesia Post Note    Patient: Austin Williams    Procedure(s) Performed: Procedure(s):  ORIF DISTAL FIBULA FRACTURE WITH SYNDESMOTIC FIXATION USING TIGHTROPE    Anesthesia type: general endotracheal    Patient location: PACU    Post pain: Adequate analgesia    Post assessment: no apparent anesthetic complications, tolerated procedure well and no evidence of recall    Last Vitals:   Filed Vitals:    06/09/14 2145   BP:    Pulse: 68   Temp:    Resp: 13   SpO2: 92%       Post vital signs: stable    Level of consciousness: awake, alert  and oriented    Complications: None
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
## 2014-06-09 NOTE — Unmapped (Signed)
2225- Report received,care assumed.DONNA M BROWNING,RN    2242- Oxycodone 10mg  po per PACU orders. Will monitor.DONNA M BROWNING,RN    2310- Resting quietly, pt gives thumbs up to answer question of how he is doing.DONNA Beverlee Nims    2317- Reviewed discharge instructions with patient and family, verbalized understanding.DONNA M BROWNING,RN    2318- #20g IV d/c'd in Left AC, intact, DSD applied.DONNA M BROWNING,RN    2348- Pt. Discharged to home via wheelchair with transportation,  home with wife. Wife has discharge instructions and prescription. All belongings with patient and wife.DONNA Beverlee Nims

## 2014-06-09 NOTE — Unmapped (Signed)
H&P reviewed, patient examined, no changes to H&P.    Ilka Lovick, MD

## 2014-06-09 NOTE — Unmapped (Signed)
Anesthesia Transfer of Care Note    Patient: Austin Williams  Procedure(s) Performed: Procedure(s):  ORIF DISTAL FIBULA FRACTURE WITH SYNDESMOTIC FIXATION USING TIGHTROPE    Patient location: PACU    Post pain: Pain needs to be addressed    Post assessment: no apparent anesthetic complications, tolerated procedure well and no evidence of recall    Post vital signs:    Filed Vitals:    06/09/14 2050   BP: 169/113   Pulse: 94   Temp: 97.4 ??F (36.3 ??C)   Resp: 15   SpO2: 100%       Level of consciousness: awake    Complications: None

## 2014-06-09 NOTE — Unmapped (Signed)
ORTHOPAEDIC SERVICE DISCHARGE INSTRUCTIONS    ORTHOPAEDIC HOTLINE:  [phone]  ORTHOPAEDIC FAX:  [phone]    *For questions please call the Orthopaedic Hotline and leave a message.*  If your call is between the hours of 7:00 AM - 3:00 PM every day, an Orthopaedic Nurse will return your call.        For emergencies after 3:00 PM and on major holidays, please call the [facility] at [phone] and ask for the Orthopaedic Resident on call or return to an Emergency Department.    Call the Orthopaedic Hotline or Return to an Emergency Department for:  1) Fever greater than 101.5??                                      2) Persistent nausea & vomiting                               3) Shortness of breath                  4) Persistent or increasing unrelieved pain  5) Foul smelling drainage from wounds/surgical sites  6) Increased redness, swelling, or warmth at wound/surgical sites.  7) Other:    INJURY/CONDITION INFORMATION:   Date of Surgery: 06/09/14    DIET:  [X]  Regular    []  Special diet:    ACTIVITY:      [X]  As tolerated    []  Shower    [X]  Sponge bath to keep dressing(s) dry    []  Do not lift more than:    []  0 pounds  []  5 pounds  []  10 pounds  []  Other:  []  No pushing, pulling, lifting or carrying       []  No excessive bending or twisting  []  Other:    RESTRICTIONS:     Left leg:    []  Weight bear as tolerated     []  Knee immobilizer  []  Toe touch weight bear     [X]  Non-weight bearing    WOUND CARE:   []  Leave sutures/staples/steri-strips in place  [X]  Keep ace wrap/splint in place until follow-up visit    PATIENT/FAMILY TEACHING:  [X]  Return to work/school on:                   Or [X]  to be determined at follow-up                   [X]  Return to driving:                                 Or [X]  to be determined at follow-up             [X]  Pain management - ice and elevation      MEDICATIONS:   []  Do not take Aspirin or Ibuprofen/NSAID (Advil, Motrin, Aleve, Naprosyn, Mobic, Celebrex) products  while taking Warfarin (Coumadin).   [X]  Do not take Ibuprofen/NSAID (Advil, Motrin, Aleve, Naprosyn, Mobic, Celebrex) products unless ok'd by Orthopaedics physician.  []  OK to take Ibuprofen/NSAID (Advil, Motrin, Aleve, Naprosyn, Mobic, Celebrex) products when taking Acetaminophen (Tylenol).     -Do not exceed 1200 mg total or 400 mg 3 times/day Ibuprofen in 24 hours unless prescribed more by a physician. 2 tablets of over the counter  200 mg strength Ibuprofen equals 400 mg.     -Always take with a full glass of water and with food.  []  OK to take Acetaminophen (Tylenol) when taking plain Oxycodone (Roxicodone).    [X]  Do not take additional Acetaminophen (Tylenol) products while taking combination medications Oxycodone/APAP (Percocets) or Hydrocodone/APAP (Lortab, Vicodin, Norco).    *Do not exceed 3000 mg (9 tablets of 325 mg strength or 6 tablets of 500 mg strength) Acetaminophen (Tylenol) in 24 hours.                             *Take pain medication as prescribed. Do not drink alcohol, drive or operate heavy machinery while on narcotics.    DISCHARGE:  [X]  Home  []  Home with 24 hour/day assistance  []  Other:    [X]  Equipment   [X]  Crutches

## 2014-06-09 NOTE — Unmapped (Addendum)
ANESTHESIOLOGY PRE-PROCEDURAL EVALUATION    Austin Williams is a 38 y.o. year old male presenting for:    Procedure(s):  ORIF DISTAL FIBULA FRACTURE WITH SYNDESMOTIC FIXATION USING TIGHTROPE VS SCREW    Surgeon:   Gayla Medicus, MD    Anesthesia Evaluation         No history of anesthetic complications   I have reviewed the History and Physical Exam, any relevant changes are noted in the anesthesia pre-operative evaluation.      Cardiovascular:    Exercise tolerance: good  Duke Met score: 4 - Raking leaves. Weeding or pushing a power mower.  (-) pacemaker, hypertension, valvular problems/murmurs, past MI, CAD, cardiomyopathy, CABG/stent, dysrhythmias, angina, CHF.    Neuro/Muscoloskeletal/Psych:    (+) neuromuscular disease (Fibular fx).    (-) seizures, TIA, CVA, headaches, psychiatric history, arthritis, back problems.     Pulmonary:      (-) no pneumonia, COPD, asthma, recent URI, sleep apnea.       GI/Hepatic/Renal:    GERD is well controlled.    (-) hepatitis, liver disease, renal disease, no end stage liver disease.    Endo/Other:        (-) diabetes mellitus, hypothyroidism, hyperthyroidism, no bleeding disorder. No opioid use      PAST MEDICAL HISTORY:  Past Medical History   Diagnosis Date   ??? Ankle fracture, left        PAST SURGICAL HISTORY:  Past Surgical History   Procedure Laterality Date   ??? Im nailing tibia Left 05/12/2014     Procedure: Irrigation & Debridement of open fracture, IM NAILING TIBIA fracture, closure of open wound;  Surgeon: Gayla Medicus, MD;  Location: [facility] OR;  Service: Orthopedics;  Laterality: Left;       FAMILY HISTORY:  Family History   Problem Relation Age of Onset   ??? Diabetes Mother    ??? Hypertension Mother    ??? Hyperlipidemia Mother    ??? Diabetes Father    ??? Asthma Father        SOCIAL HISTORY:  History     Social History   ??? Marital Status: Married     Spouse Name: N/A     Number of Children: N/A   ??? Years of Education: N/A     Occupational History   ??? Not on file.          Social History Main Topics   ??? Smoking status: Former Smoker -- 0.25 packs/day for 1 years     Types: Cigarettes   ??? Smokeless tobacco: Never Used   ??? Alcohol Use: Yes      Comment: socially   ??? Drug Use: No   ??? Sexual Activity: Yes     Partners: Female     Other Topics Concern   ??? Not on file     Social History Narrative   ??? No narrative on file       MEDICATIONS:  No current facility-administered medications on file prior to encounter.     Current Outpatient Prescriptions on File Prior to Encounter   Medication Sig Dispense Refill   ??? acetaminophen (TYLENOL) 325 MG tablet Take 2 tablets (650 mg total) by mouth every 6 hours.  90 tablet  0   ??? oxyCODONE-acetaminophen (PERCOCET) 5-325 mg per tablet Take 1-2 tablets by mouth every 4 hours as needed.  90 tablet  0   ??? senna-docusate (SENNA-S) 8.6-50 mg per tablet Take 2 tablets by mouth 2 times a day.  60 tablet  0       ALLERGIES:  Allergies   Allergen Reactions   ??? Mefenamic Acid Swelling and Rash       VITAL SIGNS:  Wt Readings from Last 3 Encounters:   06/09/14 165 lb (74.844 kg)   06/09/14 165 lb (74.844 kg)   06/06/14 169 lb 15.6 oz (77.1 kg)     Ht Readings from Last 3 Encounters:   06/09/14 5' 6 (1.676 m)   06/09/14 5' 6 (1.676 m)   05/27/14 5' 6 (1.676 m)     Temp Readings from Last 3 Encounters:   06/09/14 98.5 ??F (36.9 ??C) Oral   06/09/14 98.5 ??F (36.9 ??C) Oral   05/14/14 98.7 ??F (37.1 ??C) Oral     BP Readings from Last 3 Encounters:   06/09/14 132/78   06/09/14 132/78   05/27/14 120/79     Pulse Readings from Last 3 Encounters:   06/09/14 62   06/09/14 62   05/14/14 78     SpO2 Readings from Last 3 Encounters:   06/09/14 99%   06/09/14 99%   05/14/14 98%       Airway:     Mallampati: II  Mouth Opening: >2 FB  TM distance: > = 3 FB  Neck ROM: full  (-) no facial hair, neck not short      Dental:        Comment: Dentition otherwise ok      Pulmonary:   - normal exam    Breath sounds clear to auscultation.       Cardiovascular:  - normal exam   Rhythm:  regular  Rate: normal    Neuro/Musculoskeletal/Psych:    Mental status: alert and oriented to person, place and time.          Abdominal:     Not obese.  Abdomen: soft.    Current OB Status:       Other Findings:        LAB RESULTS:  Lab Results   Component Value Date    WBC 15.3 05/13/2014    HGB 13.0 05/13/2014    HCT 37.8 05/13/2014    MCV 89.0 05/13/2014    PLT 216 05/13/2014       No results found for this basename: ABORH       Lab Results   Component Value Date    GLUCOSE 178* 05/13/2014    BUN 14 05/13/2014    CO2 26 05/13/2014    CREATININE 0.96 05/13/2014    K 4.2 05/13/2014    NA 133 05/13/2014    CL 101 05/13/2014    CALCIUM 8.6 05/13/2014       Lab Results   Component Value Date    INR 1.0 05/12/2014       No results found for this basename: PREGTESTUR,  PREGSERUM,  HCG,  HCGQUANT       ASA 1     Anesthesia Type:  general endotracheal.     Intravenous and rapid sequence induction.    Anesthetic plan and risks discussed with patient.    Plan, alternatives, and risks of anesthesia, including death, have been explained to and discussed with the patient/legal guardian.  By my assessment, the patient/legal guardian understands and agrees.  Scenario presented in detail.  Questions answered.    Use of blood products discussed with patient whom consented to blood products.   Plan discussed with CRNA.

## 2014-06-10 ENCOUNTER — Encounter

## 2014-06-14 ENCOUNTER — Encounter

## 2014-06-17 ENCOUNTER — Inpatient Hospital Stay: Admit: 2014-06-17 | Payer: PRIVATE HEALTH INSURANCE | Attending: Sports Medicine

## 2014-06-17 ENCOUNTER — Encounter: Attending: Sports Medicine

## 2014-06-17 ENCOUNTER — Ambulatory Visit: Admit: 2014-06-17 | Discharge: 2014-06-17 | Payer: PRIVATE HEALTH INSURANCE | Attending: Sports Medicine

## 2014-06-17 DIAGNOSIS — IMO0002 Reserved for concepts with insufficient information to code with codable children: Secondary | ICD-10-CM

## 2014-06-17 DIAGNOSIS — S82402E Unspecified fracture of shaft of left fibula, subsequent encounter for open fracture type I or II with routine healing: Secondary | ICD-10-CM

## 2014-06-17 NOTE — Unmapped (Signed)
This office note has been dictated.  Patient's post op splint was removed per the doctor's instruction.   Patient tolerated this well with no complications. Patient was given instructions regarding showering/bathing.   New dressing applied. Pt's boot applied.

## 2014-06-17 NOTE — Unmapped (Signed)
[facility]  [facility]    ORTHOPEDIC SURGERY AND SPORTS MEDICINE    Austin Williams  1976/02/10    Date of Exam:   06/17/2014    Date of Surgery:  05/12/2014; 06/09/14  Surgical Procedure:  I&D and IM nail of left Grade II open tibia/fibula fracture & left fibula fracture ORIF with syndesmotic fixation with tight rope    [name] returns today for the 1st post-operative exam status post syndesmotic fixation.  The patient is doing well with appropriate complaints of pain.  He has been NWB and in a splint.  He had an oblique fracture of his distal third tibia for which he had an IM nail on 05/12/2014.  We subsequently returned to the operating room for syndesmotic disruption to perform fibula fixation with syndesmotic fixation with a tight rope device.  He is doing quite well. He still has paresthesias in the distribution of the saphenous nerve.    Exam:  The left leg, ankle, & knee are examined.  The patient???s incisions are healing well without signs of infection.  There is no erythema or drainage.  There is a minimal knee effusion.  Range of motion is from 0 to 90.  The knee is ligamentously stable to exam today. He has intact sensation to his sural, superficial peroneal, deep peroneal, & tibial nerve distributions.  He has subjective decreased sensation in the saphenous nerve distribution.  He is able to PF/DF/ & fire his EHL.  He has a palpable dp and pt pulse.    Films: Tibia fracture stabilized with IM nail in near anatomic configuration.  Minimally displaced posterior malleolus fracture. Plate and screw fixation of his lateral malleolus fracture with tight rope fixation of his ankle. Mortise is intact without any medial diastases.    Assesment: Status post I&D and IM nail of grade II open left distal tibia fracture and fibula fracture ORIF with syndesmotic fixation with tight rope    Plan:  Sutures and staples will be removed in one week.  Patient was placed in a boot and will continue to be  NWB.  I would like to see him back in 4 weeks with new x-rays to assess his progress.  We may be able to start a weight bearing protocol at that time.  Everything was explained in detail to the patient who voiced understanding and agreement with the plan.    Gayla Medicus, MD

## 2014-06-22 MED FILL — FENTANYL (PF) 50 MCG/ML INJECTION SOLUTION: 50 50 mcg/mL | INTRAMUSCULAR | Qty: 2

## 2014-06-22 MED FILL — HYDROMORPHONE 1 MG/ML INJECTION SYRINGE: 1 1 mg/mL | INTRAMUSCULAR | Qty: 1

## 2014-06-22 MED FILL — ADACEL (TDAP ADOLESN/ADULT)(PF)2 LF-(2.5-5-3-5)-5 LF/0.5 ML IM SYRINGE: 2 2 Lf-(2.5-5-3-5 mcg)-5Lf/0.5 mL | INTRAMUSCULAR | Qty: 0.5

## 2014-06-22 MED FILL — CEFAZOLIN 1 GRAM/50 ML IN DEXTROSE (ISO-OSMOTIC) INTRAVENOUS PIGGYBACK: 1 1 gram/50 mL | INTRAVENOUS | Qty: 50

## 2014-06-22 MED FILL — LACTATED RINGERS IV 1000 ML (FOR BULK CHARGING): INTRAVENOUS | Qty: 1000

## 2014-06-24 ENCOUNTER — Ambulatory Visit: Admit: 2014-06-24 | Discharge: 2014-06-24 | Payer: PRIVATE HEALTH INSURANCE | Attending: Sports Medicine

## 2014-06-24 DIAGNOSIS — M79609 Pain in unspecified limb: Secondary | ICD-10-CM

## 2014-06-24 MED ORDER — HYDROcodone-acetaminophen (NORCO) 5-325 mg per tablet
5-325 | ORAL_TABLET | Freq: Four times a day (QID) | ORAL | 0.00 refills | 15.50000 days | Status: AC | PRN
Start: 2014-06-24 — End: 2014-08-08

## 2014-06-24 NOTE — Unmapped (Signed)
This office note has been dictated.

## 2014-06-24 NOTE — Unmapped (Signed)
Sutures were removed per the doctor's instruction.   Patient tolerated this well with no complications.   Steri strips were applied.   Patient was given instructions regarding showering/bathing.

## 2014-06-24 NOTE — Unmapped (Signed)
[facility] AND SPORTS MEDICINE     PATIENT NAME:  NAGEE, GOATES                 MRN:  [id number]  DATE OF BIRTH:  [birth year]                       CSN:  [id number]  PROVIDER:  Lysbeth Galas, M.D.                VISIT DATE:  06/24/2014                                       OFFICE NOTE     CHIEF COMPLAINT:  Left leg pain.     Mr. Grays is two weeks status post surgery by Dr. Henreitta Cea.  His surgery was  on 08/13.  He is here for wound check and suture removal.  On physical exam  his left leg has some mild swelling.  No sign of erythema or synovitis.  The  wound appears to be healing well.  His sutures were removed without any  difficulty.  Steri-Strips were placed.  He will follow up with Dr. Henreitta Cea.                                              Lysbeth Galas, M.D.  SWD/jlq  D:  06/24/2014 16:45  T:  06/29/2014 16:34  Job #:  [id number]           OFFICE NOTE                                                  PAGE    1 of   1

## 2014-07-08 ENCOUNTER — Ambulatory Visit: Admit: 2014-07-08 | Discharge: 2014-07-08 | Payer: PRIVATE HEALTH INSURANCE | Attending: Sports Medicine

## 2014-07-08 ENCOUNTER — Inpatient Hospital Stay: Admit: 2014-07-08 | Payer: PRIVATE HEALTH INSURANCE | Attending: Sports Medicine

## 2014-07-08 DIAGNOSIS — S8290XD Unspecified fracture of unspecified lower leg, subsequent encounter for closed fracture with routine healing: Secondary | ICD-10-CM

## 2014-07-08 NOTE — Unmapped (Signed)
[facility]    ORTHOPEDIC SURGERY AND SPORTS MEDICINE    Austin Williams  May 17, 1976    Date of Exam:   07/08/2014    Date of Surgery:  05/12/2014; 06/09/14  Surgical Procedure:  I&D and IM nail of left Grade II open tibia/fibula fracture & left fibula fracture ORIF with syndesmotic fixation with tight rope    [name] returns today for the 4 week post-operative exam status post syndesmotic fixation.  The patient is doing well with minimal complaints of pain.  He has been NWB and in a boot.  He had an oblique fracture of his distal third tibia for which he had an IM nail on 05/12/2014.  We subsequently returned to the operating room for syndesmotic disruption to perform fibula fixation with syndesmotic fixation with a tight rope device.  He is doing quite well. He still has paresthesias in the distribution of the saphenous nerve although he feels like he is having some sensation returning. He denies numbness or tingling elsewhere about his foot.    Exam:  The left leg, ankle, & knee are examined.  The patient???s incisions are healing well without signs of infection.  There is no erythema or drainage.  There is a minimal knee effusion.  Range of motion is from 0 to 120.  The knee is ligamentously stable to exam today. He has intact sensation to his sural, superficial peroneal, deep peroneal, & tibial nerve distributions.  He has subjective decreased sensation in the saphenous nerve distribution.  He is able to PF/DF/ & fire his EHL.  He has a palpable dp and pt pulse.    Films: Tibia fracture stabilized with IM nail in near anatomic configuration.  Minimally displaced posterior malleolus fracture. Plate and screw fixation of his lateral malleolus fracture with tight rope fixation of his ankle. Mortise is intact without any medial diastasis.      Assesment: Status post I&D and IM nail of grade II open left distal tibia fracture and fibula fracture ORIF with syndesmotic fixation with tight  rope    Plan:  The patient will start a 25 percent per week weightbearing protocol which we explained in detail today. I would like to see him back in 4 weeks with new x-rays to assess his progress.  Everything was explained in detail to the patient who voiced understanding and agreement with the plan.    Gayla Medicus, MD

## 2014-07-08 NOTE — Unmapped (Signed)
Bandage was applied per the doctor's instruction.   Patient tolerated this well with no complications. Patient was given instructions regarding showering/bathing, bandage changes. Pt fit for crutches today also.

## 2014-08-08 ENCOUNTER — Ambulatory Visit: Admit: 2014-08-08 | Discharge: 2014-08-08 | Payer: PRIVATE HEALTH INSURANCE | Attending: Sports Medicine

## 2014-08-08 ENCOUNTER — Inpatient Hospital Stay: Admit: 2014-08-08 | Payer: PRIVATE HEALTH INSURANCE | Attending: Sports Medicine

## 2014-08-08 DIAGNOSIS — S82392E Other fracture of lower end of left tibia, subsequent encounter for open fracture type I or II with routine healing: Secondary | ICD-10-CM

## 2014-08-08 DIAGNOSIS — S82402E Unspecified fracture of shaft of left fibula, subsequent encounter for open fracture type I or II with routine healing: Secondary | ICD-10-CM

## 2014-08-08 MED ORDER — gabapentin (NEURONTIN) 300 MG capsule
300 | ORAL_CAPSULE | Freq: Three times a day (TID) | ORAL | Status: AC
Start: 2014-08-08 — End: 2014-09-05

## 2014-08-08 MED ORDER — oxyCODONE-acetaminophen (PERCOCET) 5-325 mg per tablet
5-325 | ORAL_TABLET | Freq: Four times a day (QID) | ORAL | Status: AC | PRN
Start: 2014-08-08 — End: ?

## 2014-08-08 NOTE — Unmapped (Addendum)
[facility]    ORTHOPEDIC SURGERY AND SPORTS MEDICINE    Devlin Brink  01-16-1976    Date of Exam:   08/08/2014    Date of Surgery:  05/12/2014; 06/09/14  Surgical Procedure:  I&D and IM nail of left Grade II open tibia/fibula fracture & left fibula fracture ORIF with syndesmotic fixation with tight rope    Odell returns today for the 2 month post-operative exam status post syndesmotic fixation.  The patient is doing well with minimal complaints of pain.  He has been progressing his weightbearing in a boot.  He is down to one crutch. He had an oblique fracture of his distal third tibia for which he had an IM nail on 05/12/2014.  We subsequently returned to the operating room for syndesmotic disruption to perform fibula fixation with syndesmotic fixation with a tight rope device.  He continues to have paresthesias in the distribution of the saphenous nerve with hyperesthesia. He denies numbness or tingling elsewhere about his foot.    Exam:  The left leg, ankle, & knee are examined.  The patient???s incisions are healing well without signs of infection.  There is no erythema or drainage.  There is a minimal knee effusion.  Range of motion is from 0 to 130.  The knee is ligamentously stable to exam today. He has intact sensation to his sural, superficial peroneal, deep peroneal, & tibial nerve distributions.  He has subjective decreased sensation in the saphenous nerve distribution. He has hyperesthesia to light touch in the distribution of the saphenous nerve. He is able to PF/DF/ & fire his EHL. He is tight in dorsiflexion he gets to just neutral. He has trouble actively flexing his toes.  He has a palpable dp and pt pulse.    Films: Tibia fracture stabilized with IM nail in near anatomic configuration.  Minimally displaced posterior malleolus fracture. Plate and screw fixation of his lateral malleolus fracture with tight rope fixation of his ankle. Mortise is intact without any  medial diastasis.      Assesment: Status post I&D and IM nail of grade II open left distal tibia fracture and fibula fracture ORIF with syndesmotic fixation with tight rope    Plan:  The patient will continue to progress his weightbearing as tolerated. I did give him a prescription for Neurontin today to help with his saphenous nerve paresthesias.  He will continue with physical therapy.  I would like to see him back in 4 weeks with new x-rays to assess his progress.  Everything was explained in detail to the patient who voiced understanding and agreement with the plan.    Gayla Medicus, MD

## 2014-09-05 ENCOUNTER — Ambulatory Visit: Admit: 2014-09-05 | Discharge: 2014-09-05 | Payer: PRIVATE HEALTH INSURANCE | Attending: Sports Medicine

## 2014-09-05 ENCOUNTER — Inpatient Hospital Stay: Admit: 2014-09-05 | Payer: PRIVATE HEALTH INSURANCE | Attending: Sports Medicine

## 2014-09-05 DIAGNOSIS — S82402E Unspecified fracture of shaft of left fibula, subsequent encounter for open fracture type I or II with routine healing: Secondary | ICD-10-CM

## 2014-09-05 DIAGNOSIS — S82202E Unspecified fracture of shaft of left tibia, subsequent encounter for open fracture type I or II with routine healing: Secondary | ICD-10-CM

## 2014-09-05 MED ORDER — pregabalin (LYRICA) 75 MG capsule
75 | ORAL_CAPSULE | Freq: Two times a day (BID) | ORAL | 0.00 refills | 30.00000 days | Status: AC
Start: 2014-09-05 — End: ?

## 2014-09-05 NOTE — Unmapped (Addendum)
[place]  [facility]    ORTHOPEDIC SURGERY AND SPORTS MEDICINE    Austin Williams  1976/01/02    Date of Exam:   09/05/2014    Date of Surgery:  05/12/2014; 06/09/14  Surgical Procedure:  I&D and IM nail of left Grade II open tibia/fibula fracture & left fibula fracture ORIF with syndesmotic fixation with tight rope    [name] returns today for the 3 month post-operative exam status post syndesmotic fixation.  The patient is doing well with minimal complaints of pain.  He has been weightbearing in a shoe.  He had an oblique fracture of his distal third tibia for which he had an IM nail on 05/12/2014.  We subsequently returned to the operating room for syndesmotic disruption to perform fibula fixation with syndesmotic fixation with a tight rope device.  He continues to have paresthesias in the distribution of the saphenous nerve with hyperesthesia. He is not tolerating Neurontin. He feels like the side effects on his mentation bother him. Patient today complains of some mild knee pain and points to his patellar tendon. He also has a question about his index finger PIP joint. He states he has a scar over it which is exquisitely tender to palpation. He denies numbness or tingling in his hand or elsewhere about his foot.    Exam:  The left leg, ankle, & knee are examined.  The patient???s incisions are well-healed without signs of infection.  There is no erythema or drainage.  There is no significant knee effusion.  Range of motion is from 0 to 130.  The knee is ligamentously stable to exam today. He does have some tenderness in the proximal patellar tendon. He has intact sensation to his sural, superficial peroneal, deep peroneal, & tibial nerve distributions.  He has subjective decreased sensation in the saphenous nerve distribution. He has hyperesthesia to light touch in the distribution of the saphenous nerve. He is able to PF/DF/ & fire his EHL. He is still a bit tight in dorsiflexion he gets to  just neutral.  He has a palpable dp and pt pulse.    Evaluation the patient's right hand shows a small scar over the index PIP joint. This is very tender to palpation. He has no tenderness about the rest of the finger or beyond the scar. He has full range of motion without weakness. The finger feels stable. He has intact sensation distally and brisk capillary refill.    Films: Tibia fracture stabilized with IM nail in near anatomic configuration. Medial tibial fracture lines are no longer visible. Minimally displaced posterior malleolus fracture. Plate and screw fixation of his lateral malleolus fracture with tight rope fixation of his ankle. Fibular fracture is becoming less distinct.  Mortise is intact without any medial diastasis.      Assesment: Status post I&D and IM nail of grade II open left distal tibia fracture and fibula fracture ORIF with syndesmotic fixation with tight rope    Plan:  The patient will continue to to work with physical therapy on his quadriceps and ankle stabilizers. I did give him a prescription for Lyrica today to help with his saphenous nerve paresthesias since he did not tolerate Neurontin and has failed attempts with that medication.  We will send him to one of my hand partners for further evaluation of his finger.  I would like to see him back in 4 weeks with new x-rays (both ankle, tibia, and knee films) to assess his progress.  Everything was explained in detail to the patient who voiced understanding and agreement with the plan.    Gayla Medicus, MD

## 2014-09-30 ENCOUNTER — Ambulatory Visit: Admit: 2014-09-30 | Discharge: 2014-09-30 | Payer: PRIVATE HEALTH INSURANCE | Attending: Hand Surgery

## 2014-09-30 ENCOUNTER — Inpatient Hospital Stay: Admit: 2014-09-30 | Payer: PRIVATE HEALTH INSURANCE | Attending: Hand Surgery

## 2014-09-30 DIAGNOSIS — Z4789 Encounter for other orthopedic aftercare: Secondary | ICD-10-CM

## 2014-09-30 DIAGNOSIS — S61219A Laceration without foreign body of unspecified finger without damage to nail, initial encounter: Secondary | ICD-10-CM

## 2014-09-30 NOTE — Unmapped (Signed)
This office note has been dictated.
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
## 2014-10-07 ENCOUNTER — Ambulatory Visit: Admit: 2014-10-07 | Discharge: 2014-10-07 | Payer: PRIVATE HEALTH INSURANCE | Attending: Sports Medicine

## 2014-10-07 ENCOUNTER — Inpatient Hospital Stay: Admit: 2014-10-07 | Payer: PRIVATE HEALTH INSURANCE | Attending: Sports Medicine

## 2014-10-07 DIAGNOSIS — S8252XD Displaced fracture of medial malleolus of left tibia, subsequent encounter for closed fracture with routine healing: Secondary | ICD-10-CM

## 2014-10-07 DIAGNOSIS — S82402E Unspecified fracture of shaft of left fibula, subsequent encounter for open fracture type I or II with routine healing: Secondary | ICD-10-CM

## 2014-10-07 NOTE — Unmapped (Addendum)
[place]  [facility]    ORTHOPEDIC SURGERY AND SPORTS MEDICINE    Austin Williams  04-09-1976    Date of Exam:   10/07/2014    Date of Surgery:  05/12/2014; 06/09/14  Surgical Procedure:  I&D and IM nail of left Grade II open tibia/fibula fracture & left fibula fracture ORIF with syndesmotic fixation with tight rope    [name] returns today for the 4 month post-operative exam status post syndesmotic fixation.  The patient is doing well with minimal complaints of pain.  He had an oblique fracture of his distal third tibia for which he had an IM nail on 05/12/2014.  We subsequently returned to the operating room for syndesmotic disruption to perform fibula fixation with syndesmotic fixation with a tight rope device.  He continues to have paresthesias in the distribution of the saphenous nerve with hyperesthesia. He did not tolerate Neurontin or Lyrica. Patient states he had some swelling after Thanksgiving when he was up on his feet for long periods time. This has been resolving.  He continues to complain of some mild knee pain and points to his patellar tendon. He states he's been working with physical therapy on his quad strength he feels like that is helping.      Exam:  The left leg, ankle, & knee are examined.  The patient???s incisions are well-healed without signs of infection.  There is no erythema or drainage.  There is no significant knee effusion.  Range of motion is from 0 to 130.  The knee is ligamentously stable to exam today. He does have some tenderness in the proximal patellar tendon. He has intact sensation to his sural, superficial peroneal, deep peroneal, & tibial nerve distributions.  He has subjective decreased sensation in the saphenous nerve distribution with hyperesthesia to light touch. He is able to PF/DF/ & fire his EHL. He has a palpable dp and pt pulse.    Films: Tibia fracture stabilized with IM nail in near anatomic configuration. Medial tibial fracture lines are no  longer visible. Minimally displaced posterior malleolus fracture. Plate and screw fixation of his lateral malleolus fracture with tight rope fixation of his ankle. Mortise is intact without any medial diastasis.      Assesment: Status post I&D and IM nail of grade II open left distal tibia fracture and fibula fracture ORIF with syndesmotic fixation with tight rope    Plan:  The patient will continue to to work with physical therapy on his quadriceps and ankle stabilizers. We did prefer to talk about next that should his nerve paresthesias not resolve.  This would consist of exploration of his saphenous nerve with neurolysis.  I would like to see him back in 4-6 weeks with new x-rays (both ankle, tibia, and knee films) to assess his progress.  Everything was explained in detail to the patient who voiced understanding and agreement with the plan.    Gayla Medicus, MD

## 2014-10-07 NOTE — Unmapped (Signed)
Pt called states he forgot to ask for a handicap placard. Please Advise    Last Seen:10/07/14    No dictation available

## 2014-10-11 NOTE — Unmapped (Signed)
Per Dr. Henreitta Cea, he is beyond 3 months post op, does not need a handicap placard anymore.  He should be walking as part of therapy anyway.  Please notify pt.

## 2014-10-11 NOTE — Unmapped (Signed)
Left message to call back

## 2014-10-14 NOTE — Unmapped (Signed)
Left message advising pt

## 2014-11-07 ENCOUNTER — Encounter: Attending: Sports Medicine

## 2016-01-16 ENCOUNTER — Ambulatory Visit
Admit: 2016-01-16 | Discharge: 2016-01-16 | Payer: PRIVATE HEALTH INSURANCE | Attending: Family Health | Primary: Family Health

## 2016-01-16 ENCOUNTER — Encounter: Attending: Family Health | Primary: Family Health

## 2016-01-16 DIAGNOSIS — J101 Influenza due to other identified influenza virus with other respiratory manifestations: Secondary | ICD-10-CM

## 2016-01-16 LAB — POCT RAPID STREP A: Strep A Ag: NOT DETECTED

## 2016-01-16 MED ORDER — OSELTAMIVIR PHOSPHATE 75 MG PO CAPS
75 MG | ORAL_CAPSULE | Freq: Two times a day (BID) | ORAL | 0 refills | Status: AC
Start: 2016-01-16 — End: 2016-01-23

## 2016-01-16 MED ORDER — ALBUTEROL SULFATE HFA 108 (90 BASE) MCG/ACT IN AERS
108 (90 Base) MCG/ACT | Freq: Four times a day (QID) | RESPIRATORY_TRACT | 3 refills | Status: AC | PRN
Start: 2016-01-16 — End: ?

## 2016-01-16 MED ORDER — LEVOCETIRIZINE DIHYDROCHLORIDE 5 MG PO TABS
5 MG | ORAL_TABLET | Freq: Every evening | ORAL | 0 refills | Status: AC
Start: 2016-01-16 — End: ?

## 2016-01-16 NOTE — Patient Instructions (Addendum)
Tamiflu twice a day for 7 days.    Flonase 2 puffs to each nostril daily.    Albuterol 2 puffs 4 times a day for 2-3 days then as needed.      Delsym twice a day as needed for cough.    Prescription cough medication twice a day as needed for cough, may cause drowsiness.    Cepacol Lozenges as needed for sore throat.      Tylenol as needed for pain and fever.       Aleve 1-2 tabs twice a day as needed.      Encourage rest and fluids.      Trial of Xyzal 5 mg nightly for Allergic Rhinitis.      Encourage to schedule a complete physical with labs.       Influenza (Flu): Care Instructions  Your Care Instructions  Influenza (flu) is an infection in the lungs and breathing passages. It is caused by the influenza virus. There are different strains, or types, of the flu virus from year to year. Unlike the common cold, the flu comes on suddenly and the symptoms, such as a cough, congestion, fever, chills, fatigue, aches, and pains, are more severe. These symptoms may last up to 10 days. Although the flu can make you feel very sick, it usually doesn't cause serious health problems.  Home treatment is usually all you need for flu symptoms. But your doctor may prescribe antiviral medicine to prevent other health problems, such as pneumonia, from developing. Older people and those who have a long-term health condition, such as lung disease, are most at risk for having pneumonia or other health problems.  Follow-up care is a key part of your treatment and safety. Be sure to make and go to all appointments, and call your doctor if you are having problems. It???s also a good idea to know your test results and keep a list of the medicines you take.  How can you care for yourself at home?  ?? Get plenty of rest.  ?? Drink plenty of fluids, enough so that your urine is light yellow or clear like water. If you have kidney, heart, or liver disease and have to limit fluids, talk with your doctor before you increase the amount of fluids you  drink.  ?? Take an over-the-counter pain medicine if needed, such as acetaminophen (Tylenol), ibuprofen (Advil, Motrin), or naproxen (Aleve), to relieve fever, headache, and muscle aches. Read and follow all instructions on the label. No one younger than 20 should take aspirin. It has been linked to Reye syndrome, a serious illness.  ?? Do not smoke. Smoking can make the flu worse. If you need help quitting, talk to your doctor about stop-smoking programs and medicines. These can increase your chances of quitting for good.  ?? Breathe moist air from a hot shower or from a sink filled with hot water to help clear a stuffy nose.  ?? Before you use cough and cold medicines, check the label. These medicines may not be safe for young children or for people with certain health problems.  ?? If the skin around your nose and lips becomes sore, put some petroleum jelly on the area.  ?? To ease coughing:  ?? Drink fluids to soothe a scratchy throat.  ?? Suck on cough drops or plain hard candy.  ?? Take an over-the-counter cough medicine that contains dextromethorphan to help you get some sleep. Read and follow all instructions on the label.  ?? Raise  your head at night with an extra pillow. This may help you rest if coughing keeps you awake.  ?? Take any prescribed medicine exactly as directed. Call your doctor if you think you are having a problem with your medicine.  To avoid spreading the flu  ?? Wash your hands regularly, and keep your hands away from your face.  ?? Stay home from school, work, and other public places until you are feeling better and your fever has been gone for at least 24 hours. The fever needs to have gone away on its own without the help of medicine.  ?? Ask people living with you to talk to their doctors about preventing the flu. They may get antiviral medicine to keep from getting the flu from you.  ?? To prevent the flu in the future, get a flu vaccine every fall. Encourage people living with you to get the  vaccine.  ?? Cover your mouth when you cough or sneeze.  When should you call for help?  Call 911 anytime you think you may need emergency care. For example, call if:  ?? You have severe trouble breathing.  Call your doctor now or seek immediate medical care if:  ?? You have new or worse trouble breathing.  ?? You seem to be getting much sicker.  ?? You feel very sleepy or confused.  ?? You have a new or higher fever.  ?? You get a new rash.  Watch closely for changes in your health, and be sure to contact your doctor if:  ?? You begin to get better and then get worse.  ?? You are not getting better after 1 week.  Where can you learn more?  Go to [email or web address] and sign in to your MyChart account. Enter [phone] in the Search Health Information box to learn more about "Influenza (Flu): Care Instructions."     If you do not have an account, please click on the "Sign Up Now" link.  Current as of: Mar 20, 2015  Content Version: 11.2  ?? 2006-2017 [employer]. Care instructions adapted under license by [facility]. If you have questions about a medical condition or this instruction, always ask your healthcare professional. [employer] disclaims any warranty or liability for your use of this information.       Sore Throat: Care Instructions  Your Care Instructions    Infection by bacteria or a virus causes most sore throats. Cigarette smoke, dry air, air pollution, allergies, and yelling can also cause a sore throat. Sore throats can be painful and annoying. Fortunately, most sore throats go away on their own. If you have a bacterial infection, your doctor may prescribe antibiotics.  Follow-up care is a key part of your treatment and safety. Be sure to make and go to all appointments, and call your doctor if you are having problems. It's also a good idea to know your test results and keep a list of the medicines you take.  How can you care for yourself at home?  ?? If your doctor  prescribed antibiotics, take them as directed. Do not stop taking them just because you feel better. You need to take the full course of antibiotics.  ?? Gargle with warm salt water once an hour to help reduce swelling and relieve discomfort. Use 1 teaspoon of salt mixed in 1 cup of warm water.  ?? Take an over-the-counter pain medicine, such as acetaminophen (Tylenol), ibuprofen (Advil, Motrin), or naproxen (Aleve). Read and follow all instructions  on the label.  ?? Be careful when taking over-the-counter cold or flu medicines and Tylenol at the same time. Many of these medicines have acetaminophen, which is Tylenol. Read the labels to make sure that you are not taking more than the recommended dose. Too much acetaminophen (Tylenol) can be harmful.  ?? Drink plenty of fluids. Fluids may help soothe an irritated throat. Hot fluids, such as tea or soup, may help decrease throat pain.  ?? Use over-the-counter throat lozenges to soothe pain. Regular cough drops or hard candy may also help. These should not be given to young children because of the risk of choking.  ?? Do not smoke or allow others to smoke around you. If you need help quitting, talk to your doctor about stop-smoking programs and medicines. These can increase your chances of quitting for good.  ?? Use a vaporizer or humidifier to add moisture to your bedroom. Follow the directions for cleaning the machine.  When should you call for help?  Call your doctor now or seek immediate medical care if:  ?? You have new or worse trouble swallowing.  ?? Your sore throat gets much worse on one side.  Watch closely for changes in your health, and be sure to contact your doctor if you do not get better as expected.  Where can you learn more?  Go to [email or web address] and sign in to your MyChart account. Enter [phone] in the Search Health Information box to learn more about "Sore Throat: Care Instructions."     If you do not have an account, please click on  the "Sign Up Now" link.  Current as of: May 26, 2015  Content Version: 11.2  ?? 2006-2017 [employer]. Care instructions adapted under license by [facility]. If you have questions about a medical condition or this instruction, always ask your healthcare professional. [employer] disclaims any warranty or liability for your use of this information.

## 2016-01-16 NOTE — Progress Notes (Signed)
SUBJECTIVE:    Patient ID:  Nicholas Hickman is a 40 y.o. male     HPI Comments: Patient is here to establish care and for complaints of URI symptoms.    URI    This is a new problem. The current episode started in the past 7 days (about week). The problem has been gradually worsening. Maximum temperature: possible. Associated symptoms include congestion (head and chest), coughing, headaches, rhinorrhea, sneezing, a sore throat and wheezing. Pertinent negatives include no abdominal pain, chest pain, diarrhea, nausea, plugged ear sensation, rash or vomiting. He has tried NSAIDs for the symptoms. The treatment provided mild relief.       Review of Systems   Constitutional: Positive for chills. Appetite change: decreased. Fever: possible.   HENT: Positive for congestion (head and chest), postnasal drip, rhinorrhea, sinus pressure, sneezing and sore throat.    Eyes: Negative for visual disturbance.   Respiratory: Positive for cough, chest tightness (sore from coughing ) and wheezing.    Cardiovascular: Negative for chest pain and palpitations.   Gastrointestinal: Negative for abdominal pain, constipation, diarrhea, nausea and vomiting.   Musculoskeletal: Positive for arthralgias (achy) and myalgias (achy).   Skin: Negative for rash.   Neurological: Positive for headaches.       OBJECTIVE:  Physical Exam   Constitutional: He is oriented to person, place, and time. He appears well-developed and well-nourished. No distress.   HENT:   Head: Normocephalic and atraumatic.   Right Ear: Tympanic membrane, external ear and ear canal normal.   Left Ear: Tympanic membrane, external ear and ear canal normal.   Nose: Right sinus exhibits no maxillary sinus tenderness and no frontal sinus tenderness. Left sinus exhibits no maxillary sinus tenderness and no frontal sinus tenderness.   Mouth/Throat: Uvula is midline and mucous membranes are normal. Posterior oropharyngeal edema and posterior oropharyngeal erythema present. No  oropharyngeal exudate.   Eyes: Conjunctivae are normal. Pupils are equal, round, and reactive to light.   Neck: Normal range of motion. Neck supple. No tracheal deviation present.   Cardiovascular: Normal rate, regular rhythm and normal heart sounds.    Pulmonary/Chest: Effort normal. No respiratory distress. He has no wheezes. He has no rales. He exhibits no tenderness.   Abdominal: Soft. Bowel sounds are normal. He exhibits no distension and no mass. There is no tenderness. There is no rebound and no guarding.   Musculoskeletal: Normal range of motion. He exhibits no edema.   Chest wall pain, reproducible with palpation.     Neurological: He is alert and oriented to person, place, and time.   Skin: Skin is warm and dry. No rash noted.   Psychiatric: He has a normal mood and affect. His behavior is normal.   Nursing note and vitals reviewed.    Visit Vitals   ??? BP 122/82   ??? Pulse 98   ??? Temp 98.2 ??F (36.8 ??C)   ??? Resp 13   ??? Ht  (1.702 m)   ??? Wt 181 lb 6.4 oz (82.3 kg)   ??? SpO2 98%   ??? BMI 28.41 kg/m2        ASSESSMENT & PLAN:    1. Influenza B  - oseltamivir (TAMIFLU) 75 MG capsule; Take 1 capsule by mouth 2 times daily for 7 days  Dispense: 14 capsule; Refill: 0  - POCT Influenza A/B (positive for B)  - Flonase 2 puffs to each nostril daily.  - Albuterol 2 puffs 4 times a day for 2-3 days  then as needed.    - Delsym twice a day as needed for cough.  - Cepacol Lozenges as needed for sore throat.    - Tylenol as needed for pain and fever.     - Aleve 1-2 tabs twice a day as needed, take with food.     - Encourage rest and fluids.      2. Sore throat  - POCT rapid strep A (negative)    3. Seasonal allergic rhinitis due to pollen  - levocetirizine (XYZAL) 5 MG tablet; Take 1 tablet by mouth nightly  Dispense: 30 tablet; Refill: 0  - albuterol sulfate HFA (PROAIR HFA) 108 (90 BASE) MCG/ACT inhaler; Inhale 2 puffs into the lungs every 6 hours as needed for Wheezing  Dispense: 1 Inhaler; Refill: 3    4.  Screening  - CBC Auto Differential; Future  - Comprehensive Metabolic Panel; Future  - Lipid Panel; Future  - Hemoglobin A1C; Future     Schedule a complete physical, labs ordered.        Continue current treatment plan.    Return if symptoms worsen or fail to improve, for influenza, [place].    Call office if experience side effects from medications.

## 2016-01-18 LAB — POCT INFLUENZA A/B
Influenza A Ab: NEGATIVE
Influenza B Ab: POSITIVE

## 2016-01-21 NOTE — Progress Notes (Signed)
Reviewed and agree with above

## 2016-02-08 ENCOUNTER — Encounter: Attending: Family Health | Primary: Family Health

## 2017-08-22 ENCOUNTER — Telehealth: Payer: Self-pay

## 2017-08-22 ENCOUNTER — Ambulatory Visit (INDEPENDENT_AMBULATORY_CARE_PROVIDER_SITE_OTHER): Payer: BLUE CROSS/BLUE SHIELD | Admitting: Family Medicine

## 2017-08-22 ENCOUNTER — Encounter: Payer: Self-pay | Admitting: Family Medicine

## 2017-08-22 VITALS — BP 138/82 | HR 70 | Temp 98.1°F | Ht 66.0 in | Wt 172.6 lb

## 2017-08-22 DIAGNOSIS — R7989 Other specified abnormal findings of blood chemistry: Secondary | ICD-10-CM

## 2017-08-22 DIAGNOSIS — Z23 Encounter for immunization: Secondary | ICD-10-CM | POA: Diagnosis not present

## 2017-08-22 DIAGNOSIS — E782 Mixed hyperlipidemia: Secondary | ICD-10-CM | POA: Insufficient documentation

## 2017-08-22 DIAGNOSIS — R7303 Prediabetes: Secondary | ICD-10-CM

## 2017-08-22 DIAGNOSIS — Z0000 Encounter for general adult medical examination without abnormal findings: Secondary | ICD-10-CM

## 2017-08-22 DIAGNOSIS — E785 Hyperlipidemia, unspecified: Secondary | ICD-10-CM

## 2017-08-22 DIAGNOSIS — E1169 Type 2 diabetes mellitus with other specified complication: Secondary | ICD-10-CM | POA: Insufficient documentation

## 2017-08-22 DIAGNOSIS — E119 Type 2 diabetes mellitus without complications: Secondary | ICD-10-CM | POA: Insufficient documentation

## 2017-08-22 LAB — LIPID PANEL
Cholesterol: 222 mg/dL — ABNORMAL HIGH (ref 0–200)
HDL: 44 mg/dL (ref >39.00)
NonHDL: 178.45
Total CHOL/HDL Ratio: 5
Triglycerides: 324 mg/dL — ABNORMAL HIGH (ref 0.0–149.0)
VLDL: 64.8 mg/dL — ABNORMAL HIGH (ref 0.0–40.0)

## 2017-08-22 LAB — COMPREHENSIVE METABOLIC PANEL
ALT: 27 U/L (ref 0–53)
AST: 29 U/L (ref 0–37)
Albumin: 4.5 g/dL (ref 3.5–5.2)
Alkaline Phosphatase: 50 U/L (ref 39–117)
BUN: 12 mg/dL (ref 6–23)
CO2: 29 mEq/L (ref 19–32)
Calcium: 9.6 mg/dL (ref 8.4–10.5)
Chloride: 102 mEq/L (ref 96–112)
Creatinine, Ser: 0.82 mg/dL (ref 0.40–1.50)
GFR: 108.76 mL/min (ref >60.00)
Glucose, Bld: 143 mg/dL — ABNORMAL HIGH (ref 70–99)
Potassium: 4.4 mEq/L (ref 3.5–5.1)
Sodium: 138 mEq/L (ref 135–145)
Total Bilirubin: 1.1 mg/dL (ref 0.2–1.2)
Total Protein: 7.1 g/dL (ref 6.0–8.3)

## 2017-08-22 LAB — LDL CHOLESTEROL, DIRECT: Direct LDL: 136 mg/dL

## 2017-08-22 LAB — VITAMIN D 25 HYDROXY (VIT D DEFICIENCY, FRACTURES): VITD: 16.5 ng/mL — ABNORMAL LOW (ref 30.00–100.00)
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
## 2017-08-22 NOTE — Telephone Encounter (Signed)
ROI faxed to [facility] [place].

## 2017-08-26 ENCOUNTER — Encounter: Payer: Self-pay | Admitting: Family Medicine

## 2017-10-30 ENCOUNTER — Encounter: Payer: Self-pay | Admitting: Physical Therapy
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
## 2018-08-11 ENCOUNTER — Ambulatory Visit (INDEPENDENT_AMBULATORY_CARE_PROVIDER_SITE_OTHER): Payer: BLUE CROSS/BLUE SHIELD | Admitting: Family Medicine

## 2018-08-11 ENCOUNTER — Ambulatory Visit (INDEPENDENT_AMBULATORY_CARE_PROVIDER_SITE_OTHER): Payer: BLUE CROSS/BLUE SHIELD

## 2018-08-11 ENCOUNTER — Encounter: Payer: Self-pay | Admitting: Family Medicine

## 2018-08-11 VITALS — BP 116/68 | HR 78 | Temp 98.7°F | Ht 66.0 in | Wt 179.0 lb

## 2018-08-11 DIAGNOSIS — Z23 Encounter for immunization: Secondary | ICD-10-CM

## 2018-08-11 DIAGNOSIS — Z114 Encounter for screening for human immunodeficiency virus [HIV]: Secondary | ICD-10-CM

## 2018-08-11 DIAGNOSIS — Z0000 Encounter for general adult medical examination without abnormal findings: Secondary | ICD-10-CM | POA: Diagnosis not present

## 2018-08-11 DIAGNOSIS — E785 Hyperlipidemia, unspecified: Secondary | ICD-10-CM

## 2018-08-11 DIAGNOSIS — R7989 Other specified abnormal findings of blood chemistry: Secondary | ICD-10-CM | POA: Diagnosis not present

## 2018-08-11 DIAGNOSIS — M25572 Pain in left ankle and joints of left foot: Secondary | ICD-10-CM

## 2018-08-11 DIAGNOSIS — M79672 Pain in left foot: Secondary | ICD-10-CM | POA: Diagnosis not present

## 2018-08-11 DIAGNOSIS — R7303 Prediabetes: Secondary | ICD-10-CM

## 2018-08-11 DIAGNOSIS — M79662 Pain in left lower leg: Secondary | ICD-10-CM | POA: Diagnosis not present

## 2018-08-11 LAB — COMPREHENSIVE METABOLIC PANEL
ALT: 23 U/L (ref 0–53)
AST: 21 U/L (ref 0–37)
Albumin: 4.5 g/dL (ref 3.5–5.2)
Alkaline Phosphatase: 51 U/L (ref 39–117)
BUN: 13 mg/dL (ref 6–23)
CO2: 28 mEq/L (ref 19–32)
Calcium: 9.7 mg/dL (ref 8.4–10.5)
Chloride: 101 mEq/L (ref 96–112)
Creatinine, Ser: 0.92 mg/dL (ref 0.40–1.50)
GFR: 94.81 mL/min (ref >60.00)
Glucose, Bld: 158 mg/dL — ABNORMAL HIGH (ref 70–99)
Potassium: 4.6 mEq/L (ref 3.5–5.1)
Sodium: 138 mEq/L (ref 135–145)
Total Bilirubin: 1.4 mg/dL — ABNORMAL HIGH (ref 0.2–1.2)
Total Protein: 7.1 g/dL (ref 6.0–8.3)

## 2018-08-11 LAB — CBC WITH DIFFERENTIAL/PLATELET
Basophils Absolute: 0 10*3/uL (ref 0.0–0.1)
Basophils Relative: 0.7 % (ref 0.0–3.0)
Eosinophils Absolute: 0.4 10*3/uL (ref 0.0–0.7)
Eosinophils Relative: 6.2 % — ABNORMAL HIGH (ref 0.0–5.0)
HCT: 44.6 % (ref 39.0–52.0)
Hemoglobin: 15.4 g/dL (ref 13.0–17.0)
Lymphocytes Relative: 36.7 % (ref 12.0–46.0)
Lymphs Abs: 2.2 10*3/uL (ref 0.7–4.0)
MCHC: 34.4 g/dL (ref 30.0–36.0)
MCV: 90.5 fl (ref 78.0–100.0)
Monocytes Absolute: 0.5 10*3/uL (ref 0.1–1.0)
Monocytes Relative: 8.7 % (ref 3.0–12.0)
Neutro Abs: 2.9 10*3/uL (ref 1.4–7.7)
Neutrophils Relative %: 47.7 % (ref 43.0–77.0)
Platelets: 218 10*3/uL (ref 150.0–400.0)
RBC: 4.93 Mil/uL (ref 4.22–5.81)
RDW: 12.3 % (ref 11.5–15.5)
WBC: 6.1 10*3/uL (ref 4.0–10.5)

## 2018-08-11 LAB — LIPID PANEL
Cholesterol: 239 mg/dL — ABNORMAL HIGH (ref 0–200)
HDL: 54.7 mg/dL (ref >39.00)
NonHDL: 184.41
Total CHOL/HDL Ratio: 4
Triglycerides: 259 mg/dL — ABNORMAL HIGH (ref 0.0–149.0)
VLDL: 51.8 mg/dL — ABNORMAL HIGH (ref 0.0–40.0)

## 2018-08-11 LAB — VITAMIN D 25 HYDROXY (VIT D DEFICIENCY, FRACTURES): VITD: 25.31 ng/mL — ABNORMAL LOW (ref 30.00–100.00)

## 2018-08-11 LAB — LDL CHOLESTEROL, DIRECT: Direct LDL: 168 mg/dL

## 2018-08-11 LAB — HEMOGLOBIN A1C: Hgb A1c MFr Bld: 7 % — ABNORMAL HIGH (ref 4.6–6.5)

## 2018-08-11 MED ORDER — KETOROLAC TROMETHAMINE 10 MG PO TABS: 10.0000 mg | ORAL_TABLET | Freq: Four times a day (QID) | ORAL | 0 refills | Status: DC | PRN

## 2018-08-12 LAB — HIV ANTIBODY (ROUTINE TESTING W REFLEX): HIV 1&2 Ab, 4th Generation: NONREACTIVE

## 2018-08-17 ENCOUNTER — Other Ambulatory Visit: Payer: Self-pay | Admitting: Surgical

## 2018-08-17 ENCOUNTER — Encounter: Payer: Self-pay | Admitting: Family Medicine

## 2018-08-17 DIAGNOSIS — E785 Hyperlipidemia, unspecified: Secondary | ICD-10-CM

## 2018-08-17 DIAGNOSIS — R7303 Prediabetes: Secondary | ICD-10-CM

## 2018-08-18 ENCOUNTER — Other Ambulatory Visit: Payer: Self-pay

## 2018-08-18 MED ORDER — METFORMIN HCL ER 750 MG PO TB24: 750.0000 mg | ORAL_TABLET | Freq: Two times a day (BID) | ORAL | 0 refills | Status: DC

## 2018-08-19 ENCOUNTER — Other Ambulatory Visit: Payer: Self-pay

## 2018-08-19 MED ORDER — ATORVASTATIN CALCIUM 20 MG PO TABS: 20.0000 mg | ORAL_TABLET | Freq: Every day | ORAL | 3 refills | Status: DC

## 2018-08-19 NOTE — Addendum Note (Signed)
Addended by: Helane Rima R on: 08/19/2018 06:46 AM   Modules accepted: Orders

## 2018-08-20 ENCOUNTER — Encounter: Payer: Self-pay | Admitting: Family Medicine

## 2018-08-20 MED ORDER — DICLOFENAC SODIUM 75 MG PO TBEC: 75.0000 mg | DELAYED_RELEASE_TABLET | Freq: Two times a day (BID) | ORAL | 0 refills | Status: DC

## 2018-08-21 DIAGNOSIS — H1045 Other chronic allergic conjunctivitis: Secondary | ICD-10-CM | POA: Diagnosis not present

## 2018-08-21 DIAGNOSIS — E119 Type 2 diabetes mellitus without complications: Secondary | ICD-10-CM | POA: Diagnosis not present

## 2018-08-25 LAB — HM DIABETES EYE EXAM

## 2018-08-31 NOTE — Progress Notes (Unsigned)
a 

## 2018-11-09 ENCOUNTER — Encounter: Payer: Self-pay | Admitting: Family Medicine

## 2018-11-16 ENCOUNTER — Ambulatory Visit: Payer: BLUE CROSS/BLUE SHIELD | Admitting: Family Medicine

## 2018-11-25 ENCOUNTER — Ambulatory Visit: Payer: BLUE CROSS/BLUE SHIELD | Admitting: Family Medicine

## 2018-11-29 NOTE — Progress Notes (Signed)
Tim Nielsen is a 43 y.o. male is here for follow up.  History of Present Illness:   Tim Nielsen, CMA acting as scribe for Dr. Briscoe Deutscher.   HPI:   Patient in office for follow up for lab work and follow up. He has increased stress at work. After filling out PHQ-9 he is concerned that he has some symptoms of depression. Still with foot pain, see previous visit.   Prediabetes - will have labs checked today. He does not check blood sugars at home. Has been taking metformin as directed. He has been working on improving diet. He does exercise 4-5 days a week for about 1 hr to 1 1/2 hrs each time. With 45 minutes of cardio each time.   Pure hypercholesterolemia - Is the patient taking medications without problems? Yes. Does the patient complain of muscle aches?  No. Trying to exercise on a regular basis? Yes. Compliant with diet? No.  Lab Results  Component Value Date   CHOL 239 (H) 08/11/2018   HDL 54.70 08/11/2018   LDLDIRECT 168.0 08/11/2018   TRIG 259.0 (H) 08/11/2018   CHOLHDL 4 08/11/2018   Lab Results  Component Value Date   ALT 23 08/11/2018   AST 21 08/11/2018   ALKPHOS 51 08/11/2018   BILITOT 1.4 (H) 08/11/2018     There are no preventive care reminders to display for this patient.   Depression screen [facility] 2/9 11/30/2018 08/22/2017  Decreased Interest 0 0  Down, Depressed, Hopeless 1 0  PHQ - 2 Score 1 0  Altered sleeping 1 -  Tired, decreased energy 1 -  Change in appetite 0 -  Feeling bad or failure about yourself  0 -  Trouble concentrating 1 -  Moving slowly or fidgety/restless 0 -  Suicidal thoughts 0 -  PHQ-9 Score 4 -  Difficult doing work/chores Not difficult at all -   PMHx, SurgHx, SocialHx, FamHx, Medications, and Allergies were reviewed in the Visit Navigator and updated as appropriate.   Patient Active Problem List   Diagnosis Date Noted  . HLD (hyperlipidemia)   . Prediabetes    Social History   Tobacco Use  . Smoking  status: Former Research scientist (life sciences)  . Smokeless tobacco: Former Network engineer Use Topics  . Alcohol use: Yes    Comment: socially  . Drug use: No   Current Medications and Allergies   .  atorvastatin (LIPITOR) 20 MG tablet, Take 1 tablet (20 mg total) by mouth daily., Disp: 90 tablet, Rfl: 3 .  diclofenac (VOLTAREN) 75 MG EC tablet, Take 1 tablet (75 mg total) by mouth 2 (two) times daily., Disp: 30 tablet, Rfl: 0 .  ketorolac (TORADOL) 10 MG tablet, Take 1 tablet (10 mg total) by mouth every 6 (six) hours as needed., Disp: 20 tablet, Rfl: 0 .  metFORMIN (GLUCOPHAGE XR) 750 MG 24 hr tablet, Take 1 tablet (750 mg total) by mouth 2 (two) times daily., Disp: 180 tablet, Rfl: 0  No Known Allergies   Review of Systems   Pertinent items are noted in the HPI. Otherwise, a complete ROS is negative.  Vitals   Vitals:   11/30/18 0844  BP: 116/64  Pulse: 73  Temp: 98.3 F (36.8 C)  TempSrc: Oral  SpO2: 99%  Weight: 173 lb (78.5 kg)  Height: '5\' 6"'$  (1.676 m)     Body mass index is 27.92 kg/m.  Physical Exam   Physical Exam Vitals signs and nursing note reviewed.  Constitutional:  General: He is not in acute distress.    Appearance: He is well-developed.  HENT:     Head: Normocephalic and atraumatic.     Right Ear: External ear normal.     Left Ear: External ear normal.     Nose: Nose normal.  Eyes:     Conjunctiva/sclera: Conjunctivae normal.     Pupils: Pupils are equal, round, and reactive to light.  Neck:     Musculoskeletal: Neck supple.  Cardiovascular:     Rate and Rhythm: Normal rate and regular rhythm.  Pulmonary:     Effort: Pulmonary effort is normal.  Abdominal:     General: Bowel sounds are normal.     Palpations: Abdomen is soft.  Musculoskeletal: Normal range of motion.  Skin:    General: Skin is warm.  Neurological:     Mental Status: He is alert.  Psychiatric:        Behavior: Behavior normal.    Results for orders placed or performed in visit on  11/30/18  POCT glycosylated hemoglobin (Hb A1C)  Result Value Ref Range   Hemoglobin A1C 6.0 (A) 4.0 - 5.6 %   Assessment and Plan   Kirtan was seen today for follow-up.  Diagnoses and all orders for this visit:  Type 2 diabetes mellitus with other specified complication, without long-term current use of insulin (HCC) -     Comp Met (CMET) -     metFORMIN (GLUCOPHAGE XR) 750 MG 24 hr tablet; Take 1 tablet (750 mg total) by mouth daily. -     POCT glycosylated hemoglobin (Hb A1C)  Hyperlipidemia associated with type 2 diabetes mellitus (HCC) -     Comp Met (CMET) -     atorvastatin (LIPITOR) 20 MG tablet; Take 1 tablet (20 mg total) by mouth daily.  Left foot pain -     Ambulatory referral to Sports Medicine   . Orders and follow up as documented in [place], reviewed diet, exercise and weight control, cardiovascular risk and specific lipid/LDL goals reviewed, reviewed medications and side effects in detail.  . Reviewed expectations re: course of current medical issues. . Outlined signs and symptoms indicating need for more acute intervention. . Patient verbalized understanding and all questions were answered. . Patient received an After Visit Summary.  CMA served as Education administrator during this visit. History, Physical, and Plan performed by medical provider. The above documentation has been reviewed and is accurate and complete. Briscoe Deutscher, D.O.  Briscoe Deutscher, DO [place] [place] 11/30/2018

## 2018-11-30 ENCOUNTER — Encounter: Payer: Self-pay | Admitting: Family Medicine

## 2018-11-30 ENCOUNTER — Ambulatory Visit (INDEPENDENT_AMBULATORY_CARE_PROVIDER_SITE_OTHER): Payer: BLUE CROSS/BLUE SHIELD | Admitting: Family Medicine

## 2018-11-30 VITALS — BP 116/64 | HR 73 | Temp 98.3°F | Ht 66.0 in | Wt 173.0 lb

## 2018-11-30 DIAGNOSIS — M79672 Pain in left foot: Secondary | ICD-10-CM

## 2018-11-30 DIAGNOSIS — E785 Hyperlipidemia, unspecified: Secondary | ICD-10-CM | POA: Diagnosis not present

## 2018-11-30 DIAGNOSIS — E1169 Type 2 diabetes mellitus with other specified complication: Secondary | ICD-10-CM

## 2018-11-30 DIAGNOSIS — R7303 Prediabetes: Secondary | ICD-10-CM | POA: Diagnosis not present

## 2018-11-30 LAB — COMPREHENSIVE METABOLIC PANEL
ALT: 31 U/L (ref 0–53)
AST: 23 U/L (ref 0–37)
Albumin: 4.6 g/dL (ref 3.5–5.2)
Alkaline Phosphatase: 42 U/L (ref 39–117)
BUN: 7 mg/dL (ref 6–23)
CO2: 30 mEq/L (ref 19–32)
Calcium: 9.5 mg/dL (ref 8.4–10.5)
Chloride: 103 mEq/L (ref 96–112)
Creatinine, Ser: 0.89 mg/dL (ref 0.40–1.50)
GFR: 92.55 mL/min (ref >60.00)
Glucose, Bld: 124 mg/dL — ABNORMAL HIGH (ref 70–99)
Potassium: 4.4 mEq/L (ref 3.5–5.1)
Sodium: 139 mEq/L (ref 135–145)
Total Bilirubin: 1.2 mg/dL (ref 0.2–1.2)
Total Protein: 6.9 g/dL (ref 6.0–8.3)

## 2018-11-30 LAB — POCT GLYCOSYLATED HEMOGLOBIN (HGB A1C): Hemoglobin A1C: 6 % — AB (ref 4.0–5.6)

## 2018-11-30 MED ORDER — METFORMIN HCL ER 750 MG PO TB24: 750.0000 mg | ORAL_TABLET | Freq: Every day | ORAL | 3 refills | Status: DC

## 2018-11-30 MED ORDER — ATORVASTATIN CALCIUM 20 MG PO TABS: 20.0000 mg | ORAL_TABLET | Freq: Every day | ORAL | 3 refills | Status: DC

## 2018-11-30 NOTE — Patient Instructions (Signed)
Fat and Cholesterol Restricted Eating Plan Getting too much fat and cholesterol in your diet may cause health problems. Choosing the right foods helps keep your fat and cholesterol at normal levels. This can keep you from getting certain diseases. Your doctor may recommend an eating plan that includes:  Total fat: ______% or less of total calories a day.  Saturated fat: ______% or less of total calories a day.  Cholesterol: less than _________mg a day.  Fiber: ______g a day. What are tips for following this plan? Meal planning  At meals, divide your plate into four equal parts: ? Fill one-half of your plate with vegetables and green salads. ? Fill one-fourth of your plate with whole grains. ? Fill one-fourth of your plate with low-fat (lean) protein foods.  Eat fish that is high in omega-3 fats at least two times a week. This includes mackerel, tuna, sardines, and salmon.  Eat foods that are high in fiber, such as whole grains, beans, apples, broccoli, carrots, peas, and barley. General tips   Work with your doctor to lose weight if you need to.  Avoid: ? Foods with added sugar. ? Fried foods. ? Foods with partially hydrogenated oils.  Limit alcohol intake to no more than 1 drink a day for nonpregnant women and 2 drinks a day for men. One drink equals 12 oz of beer, 5 oz of wine, or 1 oz of hard liquor. Reading food labels  Check food labels for: ? Trans fats. ? Partially hydrogenated oils. ? Saturated fat (g) in each serving. ? Cholesterol (mg) in each serving. ? Fiber (g) in each serving.  Choose foods with healthy fats, such as: ? Monounsaturated fats. ? Polyunsaturated fats. ? Omega-3 fats.  Choose grain products that have whole grains. Look for the word "whole" as the first word in the ingredient list. Cooking  Cook foods using low-fat methods. These include baking, boiling, grilling, and broiling.  Eat more home-cooked foods. Eat at restaurants and buffets  less often.  Avoid cooking using saturated fats, such as butter, cream, palm oil, palm kernel oil, and coconut oil. Recommended foods  Fruits  All fresh, canned (in natural juice), or frozen fruits. Vegetables  Fresh or frozen vegetables (raw, steamed, roasted, or grilled). Green salads. Grains  Whole grains, such as whole wheat or whole grain breads, crackers, cereals, and pasta. Unsweetened oatmeal, bulgur, barley, quinoa, or brown rice. Corn or whole wheat flour tortillas. Meats and other protein foods  Ground beef (85% or leaner), grass-fed beef, or beef trimmed of fat. Skinless chicken or turkey. Ground chicken or turkey. Pork trimmed of fat. All fish and seafood. Egg whites. Dried beans, peas, or lentils. Unsalted nuts or seeds. Unsalted canned beans. Nut butters without added sugar or oil. Dairy  Low-fat or nonfat dairy products, such as skim or 1% milk, 2% or reduced-fat cheeses, low-fat and fat-free ricotta or cottage cheese, or plain low-fat and nonfat yogurt. Fats and oils  Tub margarine without trans fats. Light or reduced-fat mayonnaise and salad dressings. Avocado. Olive, canola, sesame, or safflower oils. The items listed above may not be a complete list of foods and beverages you can eat. Contact a dietitian for more information. Foods to avoid Fruits  Canned fruit in heavy syrup. Fruit in cream or butter sauce. Fried fruit. Vegetables  Vegetables cooked in cheese, cream, or butter sauce. Fried vegetables. Grains  White bread. White pasta. White rice. Cornbread. Bagels, pastries, and croissants. Crackers and snack foods that contain trans fat   and hydrogenated oils. Meats and other protein foods  Fatty cuts of meat. Ribs, chicken wings, bacon, sausage, bologna, salami, chitterlings, fatback, hot dogs, bratwurst, and packaged lunch meats. Liver and organ meats. Whole eggs and egg yolks. Chicken and turkey with skin. Fried meat. Dairy  Whole or 2% milk, cream,  half-and-half, and cream cheese. Whole milk cheeses. Whole-fat or sweetened yogurt. Full-fat cheeses. Nondairy creamers and whipped toppings. Processed cheese, cheese spreads, and cheese curds. Beverages  Alcohol. Sugar-sweetened drinks such as sodas, lemonade, and fruit drinks. Fats and oils  Butter, stick margarine, lard, shortening, ghee, or bacon fat. Coconut, palm kernel, and palm oils. Sweets and desserts  Corn syrup, sugars, honey, and molasses. Candy. Jam and jelly. Syrup. Sweetened cereals. Cookies, pies, cakes, donuts, muffins, and ice cream. The items listed above may not be a complete list of foods and beverages you should avoid. Contact a dietitian for more information. Summary  Choosing the right foods helps keep your fat and cholesterol at normal levels. This can keep you from getting certain diseases.  At meals, fill one-half of your plate with vegetables and green salads.  Eat high-fiber foods, like whole grains, beans, apples, carrots, peas, and barley.  Limit added sugar, saturated fats, alcohol, and fried foods. This information is not intended to replace advice given to you by your health care provider. Make sure you discuss any questions you have with your health care provider. Document Released: 04/14/2012 Document Revised: 06/17/2018 Document Reviewed: 07/01/2017 Elsevier Interactive Patient Education  2019 [employer]   

## 2018-12-01 ENCOUNTER — Ambulatory Visit (INDEPENDENT_AMBULATORY_CARE_PROVIDER_SITE_OTHER): Payer: BLUE CROSS/BLUE SHIELD | Admitting: Sports Medicine

## 2018-12-01 ENCOUNTER — Encounter: Payer: Self-pay | Admitting: Sports Medicine

## 2018-12-01 ENCOUNTER — Ambulatory Visit: Payer: Self-pay

## 2018-12-01 VITALS — BP 122/86 | HR 67 | Ht 72.0 in | Wt 174.4 lb

## 2018-12-01 DIAGNOSIS — M79672 Pain in left foot: Secondary | ICD-10-CM

## 2018-12-01 DIAGNOSIS — E1169 Type 2 diabetes mellitus with other specified complication: Secondary | ICD-10-CM

## 2018-12-01 DIAGNOSIS — S82402B Unspecified fracture of shaft of left fibula, initial encounter for open fracture type I or II: Secondary | ICD-10-CM

## 2018-12-01 DIAGNOSIS — R269 Unspecified abnormalities of gait and mobility: Secondary | ICD-10-CM

## 2018-12-01 DIAGNOSIS — M216X2 Other acquired deformities of left foot: Secondary | ICD-10-CM

## 2018-12-01 DIAGNOSIS — S82202B Unspecified fracture of shaft of left tibia, initial encounter for open fracture type I or II: Secondary | ICD-10-CM | POA: Insufficient documentation

## 2018-12-01 DIAGNOSIS — S82202S Unspecified fracture of shaft of left tibia, sequela: Secondary | ICD-10-CM

## 2018-12-01 DIAGNOSIS — S82402S Unspecified fracture of shaft of left fibula, sequela: Secondary | ICD-10-CM

## 2018-12-01 MED ORDER — DICLOFENAC SODIUM 2 % TD SOLN: 1.0000 "application " | Freq: Two times a day (BID) | TRANSDERMAL | 0 refills | Status: AC

## 2018-12-01 MED ORDER — DICLOFENAC SODIUM 2 % TD SOLN: 1.0000 "application " | Freq: Two times a day (BID) | TRANSDERMAL | 2 refills | Status: DC
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
## 2018-12-01 NOTE — Patient Instructions (Addendum)
The cost of the pair of custom orthotics is $195.  You can look into having your insurance company cover the cost of these. Some insurance companies cover the cost and other do not.  If they do not you will be responsible for the full cost of the orthotics.  I am happy to do these for you at any time, you just need to let our front office schedulers know you would like an "orthotic appointment."  Please also make sure you bring athletic shoes with you on the day of your orthotic appointment or whatever shoes you plan to wear your orthotics in most frequently.   When you call your insurance company you will need to provide them the CPT code which is L3030 and there are 2 units.  You can call them  and ask if this is covered.      Try over the counter CAPSAICIN Cream up to 4 times per day Add B Complex to your daily regimen.  It is okay to take up to double the recommended daily dose    Pennsaid instructions: You have been given a sample/prescription for Pennsaid, a topical medication.     You are to apply this gel to your injured body part twice daily (morning and evening).   A little goes a long way so you can use about a pea-sized amount for each area.   Spread this small amount over the area into a thin film and let it dry.   Be sure that you do not rub the gel into your skin for more than 10 or 15 seconds otherwise it can irritate you skin.    Once you apply the gel, please do not put any other lotion or clothing in contact with that area for 30 minutes to allow the gel to absorb into your skin.   Some people are sensitive to the medication and can develop a sunburn-like rash.  If you have only mild symptoms it is okay to continue to use the medication but if you have any breakdown of your skin you should discontinue its use and please let us know.   If you have been written a prescription for Pennsaid, you will receive a pump bottle of this topical gel through a mail order  pharmacy.  The instructions on the bottle will say to apply two pumps twice a day which may be too much gel for your particular area so use the pea-sized amount as your guide.

## 2018-12-08 ENCOUNTER — Encounter: Payer: Self-pay | Admitting: Sports Medicine

## 2018-12-08 ENCOUNTER — Ambulatory Visit (INDEPENDENT_AMBULATORY_CARE_PROVIDER_SITE_OTHER): Payer: BLUE CROSS/BLUE SHIELD | Admitting: Sports Medicine

## 2018-12-08 DIAGNOSIS — E785 Hyperlipidemia, unspecified: Secondary | ICD-10-CM | POA: Diagnosis not present

## 2018-12-08 DIAGNOSIS — E1169 Type 2 diabetes mellitus with other specified complication: Secondary | ICD-10-CM | POA: Diagnosis not present

## 2018-12-08 DIAGNOSIS — M216X2 Other acquired deformities of left foot: Secondary | ICD-10-CM

## 2018-12-08 DIAGNOSIS — R269 Unspecified abnormalities of gait and mobility: Secondary | ICD-10-CM

## 2018-12-08 NOTE — Progress Notes (Signed)
  Tim Nielsen. [facility] at [facility] [phone]  Tim Nielsen - 43 y.o. male MRN [id number]  Date of birth: 22-Apr-1976  Visit Date:   PCP: Helane Rima, DO   Referred by: Helane Rima, DO  SUBJECTIVE:  Tim Nielsen is here for a procedure only visit for custom orthotic fabrication.  OBJECTIVE:  PHYSICAL EXAM: Please see previous exam notes for full evaluation of foot and gait exam. MSK Exam: Overpronation at rest.   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  ASSESSMENT   1. Loss of transverse plantar arch of left foot   2. Gait disturbance   3. Hyperlipidemia associated with type 2 diabetes mellitus (HCC)     PLAN & PROCEDURES:   . Custom orthotics fabricated today as below     PROCEDURE: CUSTOM ORTHOTIC FABRICATION Patient's underlying musculoskeletal conditions are directly related to poor biomechanics and will benefit from a functional custom orthotic.  There are no significant foot deformities that complicate the use of a custom orthotic.  The patient was fitted for a standard, cushioned, semi-rigid orthotic. The orthotic was heated & placed on the orthotic stand. The patient was positioned in subtalar neutral position and 10 of ankle dorsiflexion and weight bearing stance on the heated orthotic blank. After completion of the molding a base was applied to the orthotic blank. The orthotic was ground to a stable position for weightbearing. The patient ambulated in these and reported they were comfortable without pressure spots.              BLANK:  Size 10 - Standard Cushioned               BASE:  Blue EVA      POSTINGS:  None, high cavus arch           Andrena Mews, DO    [facility] Physician

## 2019-08-09 ENCOUNTER — Ambulatory Visit: Payer: BLUE CROSS/BLUE SHIELD | Admitting: Family Medicine

## 2019-10-04 ENCOUNTER — Encounter: Payer: Self-pay | Admitting: Family Medicine

## 2019-10-04 ENCOUNTER — Other Ambulatory Visit: Payer: Self-pay

## 2019-10-05 ENCOUNTER — Encounter: Payer: Self-pay | Admitting: Family Medicine

## 2019-10-05 ENCOUNTER — Ambulatory Visit (INDEPENDENT_AMBULATORY_CARE_PROVIDER_SITE_OTHER): Payer: BC Managed Care – PPO | Admitting: Family Medicine

## 2019-10-05 VITALS — BP 130/78 | HR 72 | Temp 97.3°F | Ht 66.0 in | Wt 180.0 lb

## 2019-10-05 DIAGNOSIS — E663 Overweight: Secondary | ICD-10-CM | POA: Diagnosis not present

## 2019-10-05 DIAGNOSIS — E1169 Type 2 diabetes mellitus with other specified complication: Secondary | ICD-10-CM

## 2019-10-05 DIAGNOSIS — Z0000 Encounter for general adult medical examination without abnormal findings: Secondary | ICD-10-CM | POA: Diagnosis not present

## 2019-10-05 DIAGNOSIS — Z23 Encounter for immunization: Secondary | ICD-10-CM

## 2019-10-05 DIAGNOSIS — E119 Type 2 diabetes mellitus without complications: Secondary | ICD-10-CM | POA: Diagnosis not present

## 2019-10-05 DIAGNOSIS — E782 Mixed hyperlipidemia: Secondary | ICD-10-CM | POA: Diagnosis not present

## 2019-10-05 LAB — CBC WITH DIFFERENTIAL/PLATELET
Basophils Absolute: 0 10*3/uL (ref 0.0–0.1)
Basophils Relative: 0.4 % (ref 0.0–3.0)
Eosinophils Absolute: 0.3 10*3/uL (ref 0.0–0.7)
Eosinophils Relative: 4.7 % (ref 0.0–5.0)
HCT: 44.1 % (ref 39.0–52.0)
Hemoglobin: 15 g/dL (ref 13.0–17.0)
Lymphocytes Relative: 33.6 % (ref 12.0–46.0)
Lymphs Abs: 2.2 10*3/uL (ref 0.7–4.0)
MCHC: 34.1 g/dL (ref 30.0–36.0)
MCV: 89.9 fl (ref 78.0–100.0)
Monocytes Absolute: 0.6 10*3/uL (ref 0.1–1.0)
Monocytes Relative: 9 % (ref 3.0–12.0)
Neutro Abs: 3.5 10*3/uL (ref 1.4–7.7)
Neutrophils Relative %: 52.3 % (ref 43.0–77.0)
Platelets: 224 10*3/uL (ref 150.0–400.0)
RBC: 4.9 Mil/uL (ref 4.22–5.81)
RDW: 12.5 % (ref 11.5–15.5)
WBC: 6.7 10*3/uL (ref 4.0–10.5)

## 2019-10-05 LAB — COMPREHENSIVE METABOLIC PANEL
ALT: 38 U/L (ref 0–53)
AST: 33 U/L (ref 0–37)
Albumin: 4.5 g/dL (ref 3.5–5.2)
Alkaline Phosphatase: 49 U/L (ref 39–117)
BUN: 14 mg/dL (ref 6–23)
CO2: 29 mEq/L (ref 19–32)
Calcium: 9.6 mg/dL (ref 8.4–10.5)
Chloride: 100 mEq/L (ref 96–112)
Creatinine, Ser: 0.92 mg/dL (ref 0.40–1.50)
GFR: 88.74 mL/min (ref >60.00)
Glucose, Bld: 170 mg/dL — ABNORMAL HIGH (ref 70–99)
Potassium: 4 mEq/L (ref 3.5–5.1)
Sodium: 136 mEq/L (ref 135–145)
Total Bilirubin: 1.5 mg/dL — ABNORMAL HIGH (ref 0.2–1.2)
Total Protein: 7.2 g/dL (ref 6.0–8.3)

## 2019-10-05 LAB — LIPID PANEL
Cholesterol: 194 mg/dL (ref 0–200)
HDL: 52.5 mg/dL (ref >39.00)
NonHDL: 141.84
Total CHOL/HDL Ratio: 4
Triglycerides: 315 mg/dL — ABNORMAL HIGH (ref 0.0–149.0)
VLDL: 63 mg/dL — ABNORMAL HIGH (ref 0.0–40.0)

## 2019-10-05 LAB — LDL CHOLESTEROL, DIRECT: Direct LDL: 105 mg/dL

## 2019-10-05 LAB — POCT GLYCOSYLATED HEMOGLOBIN (HGB A1C): Hemoglobin A1C: 6.4 % — AB (ref 4.0–5.6)

## 2019-10-05 LAB — MICROALBUMIN / CREATININE URINE RATIO
Creatinine,U: 224.1 mg/dL
Microalb Creat Ratio: 0.4 mg/g (ref 0.0–30.0)
Microalb, Ur: 0.9 mg/dL (ref 0.0–1.9)

## 2019-10-05 NOTE — Progress Notes (Signed)
Subjective  Chief Complaint  Patient presents with  . Transitions Of Care  . Annual Exam    HPI: Tim Nielsen is a 43 y.o. male who presents to [facility] [place] today for a Male Wellness Visit. He also has the concerns and/or needs as listed above in the chief complaint. These will be addressed in addition to the Health Maintenance Visit. Also TOC visit, former Dr. Juleen China patient.   Wellness Visit: annual visit with health maintenance review and exam    HM: was eating a whole foods diet but slipped off that for several months now. Weight is back up. Feels fine. Will start back eating better. Discussed his diet. Due flu and pneumovax. Fasting for labwork.  Lifestyle: Body mass index is 29.05 kg/m. Wt Readings from Last 3 Encounters:  10/05/19 180 lb (81.6 kg)  12/01/18 174 lb 6.4 oz (79.1 kg)  11/30/18 173 lb (78.5 kg)    Chronic disease management visit and/or acute problem visit:  Diabetes follow up: His diabetic control is reported as unchanged. As above, he has regained some weight but denies sxs of hyperglycemia. Has + FH of diabetes. Eats mostly well but perhaps too many carbs and fats.  He denies exertional CP or SOB or symptomatic hypoglycemia. He denies foot sores or paresthesias. He is due an eye exam.   HLD: started on statin earlier this year and due for recheck. Tolerating well.   H/o elevated lfts: due for recheck. Was attributed to fatty liver.   Obesity: discussed importance of weight loss to reverse or control his diabetes.   Immunization History  Administered Date(s) Administered  . Influenza,inj,Quad PF,6+ Mos 08/22/2017, 08/11/2018, 10/05/2019  . Pneumococcal Polysaccharide-23 10/05/2019  . Tdap 08/11/2018    Diabetes Related Lab Review: Lab Results  Component Value Date   HGBA1C 6.4 (A) 10/05/2019   HGBA1C 6.0 (A) 11/30/2018   HGBA1C 7.0 (H) 08/11/2018    No results found for: [facility] Lab Results   Component Value Date   CREATININE 0.89 11/30/2018   BUN 7 11/30/2018   NA 139 11/30/2018   K 4.4 11/30/2018   CL 103 11/30/2018   CO2 30 11/30/2018   Lab Results  Component Value Date   CHOL 239 (H) 08/11/2018   CHOL 222 (H) 08/22/2017   Lab Results  Component Value Date   HDL 54.70 08/11/2018   HDL 44.00 08/22/2017   No results found for: [facility] Lab Results  Component Value Date   TRIG 259.0 (H) 08/11/2018   TRIG 324.0 (H) 08/22/2017   Lab Results  Component Value Date   CHOLHDL 4 08/11/2018   CHOLHDL 5 08/22/2017   Lab Results  Component Value Date   LDLDIRECT 168.0 08/11/2018   LDLDIRECT 136.0 08/22/2017   The 10-year ASCVD risk score Mikey Bussing DC Jr., et al., 2013) is: 4.8%   Values used to calculate the score:     Age: 68 years     Sex: Male     Is Non-Hispanic African American: No     Diabetic: Yes     Tobacco smoker: No     Systolic Blood Pressure: 469 mmHg     Is BP treated: No     HDL Cholesterol: 54.7 mg/dL     Total Cholesterol: 239 mg/dL  BP Readings from Last 3 Encounters:  10/05/19 130/78  12/01/18 122/86  11/30/18 116/64   Wt Readings from Last 3 Encounters:  10/05/19 180 lb (81.6 kg)  12/01/18 174  lb 6.4 oz (79.1 kg)  11/30/18 173 lb (78.5 kg)    Health Maintenance  Topic Date Due  . URINE MICROALBUMIN  03/12/1984  . OPHTHALMOLOGY EXAM  08/26/2019  . HEMOGLOBIN A1C  04/04/2020  . FOOT EXAM  10/04/2020  . TETANUS/TDAP  08/11/2028  . INFLUENZA VACCINE  Completed  . PNEUMOCOCCAL POLYSACCHARIDE VACCINE AGE 295-64 HIGH RISK  Completed  . HIV Screening  Completed     Patient Active Problem List   Diagnosis Date Noted  . Overweight 10/05/2019  . Combined hyperlipidemia associated with type 2 diabetes mellitus ([place])   . Controlled type 2 diabetes mellitus without complication, without long-term current use of insulin [facility])    Health Maintenance  Topic Date Due  . URINE MICROALBUMIN  03/12/1984  . OPHTHALMOLOGY EXAM  08/26/2019  .  HEMOGLOBIN A1C  04/04/2020  . FOOT EXAM  10/04/2020  . TETANUS/TDAP  08/11/2028  . INFLUENZA VACCINE  Completed  . PNEUMOCOCCAL POLYSACCHARIDE VACCINE AGE 295-64 HIGH RISK  Completed  . HIV Screening  Completed   Immunization History  Administered Date(s) Administered  . Influenza,inj,Quad PF,6+ Mos 08/22/2017, 08/11/2018, 10/05/2019  . Pneumococcal Polysaccharide-23 10/05/2019  . Tdap 08/11/2018   We updated and reviewed the patient's past history in detail and it is documented below. Allergies: Patient has No Known Allergies. Past Medical History  has a past medical history of Complex regional pain syndrome of left lower extremity, Diabetes mellitus without complication ([place]), History of colonoscopy (2014), and HLD (hyperlipidemia). Past Surgical History Patient  has a past surgical history that includes ORIF ankle fracture (Left). Social History Patient  reports that he has quit smoking. He has quit using smokeless tobacco. He reports current alcohol use. He reports that he does not use drugs. Family History family history includes Diabetes in his father and mother; Healthy in his daughter and daughter; Hyperlipidemia in his father and mother; Hypertension in his father and mother. Review of Systems: Constitutional: negative for fever or malaise Ophthalmic: negative for photophobia, double vision or loss of vision Cardiovascular: negative for chest pain, dyspnea on exertion, or new LE swelling Respiratory: negative for SOB or persistent cough Gastrointestinal: negative for abdominal pain, change in bowel habits or melena Genitourinary: negative for dysuria or gross hematuria Musculoskeletal: negative for new gait disturbance or muscular weakness, + for hypersensitivity right ankle/paresthesias Integumentary: negative for new or persistent rashes Neurological: negative for TIA or stroke symptoms Psychiatric: negative for SI or delusions Allergic/Immunologic: negative for hives   Patient Care Team    Relationship Specialty Notifications Start End  Leamon Arnt, MD PCP - General Family Medicine  10/05/19    Objective  Vitals: BP 130/78 (BP Location: Left Arm, Patient Position: Sitting, Cuff Size: Normal)   Pulse 72   Temp (!) 97.3 F (36.3 C) (Temporal)   Ht _0  (1.676 m)   Wt 180 lb (81.6 kg)   SpO2 100%   BMI 29.05 kg/m  General:  Well developed, well nourished, no acute distress  Psych:  Alert and orientedx3,normal mood and affect HEENT:  Normocephalic, atraumatic, non-icteric sclera, PERRL, oropharynx is clear without mass or exudate, supple neck without adenopathy, mass or thyromegaly Cardiovascular:  Normal S1, S2, RRR without gallop, rub or murmur, nondisplaced PMI, +2 distal pulses in bilateral upper and lower extremities. Respiratory:  Good breath sounds bilaterally, CTAB with normal respiratory effort Gastrointestinal: normal bowel sounds, soft, non-tender, no noted masses. No HSM MSK: no deformities, contusions. Joints are without erythema or swelling. Spine and  CVA region are nontender Skin:  Warm, no rashes or suspicious lesions noted Neurologic:    Mental status is normal. CN 2-11 are normal. Gross motor and sensory exams are normal. Stable gait. No tremor GU: No inguinal hernias or adenopathy are appreciated bilaterally Diabetic Foot Exam: Appearance - no lesions, ulcers or calluses Skin - no sigificant pallor or erythema Monofilament testing - sensitive bilaterally in following locations:  Right - Great toe, medial, central, lateral ball and posterior foot intact  Left - Great toe, medial, central, lateral ball and posterior foot intact Pulses - +2 distally bilaterally    Assessment  1. Annual physical exam   2. Controlled type 2 diabetes mellitus without complication, without long-term current use of insulin ([place])   3. Combined hyperlipidemia associated with type 2 diabetes mellitus ([place])   4. Need for immunization against influenza    5. Need for pneumococcal vaccination   6. Overweight      Plan  Male Wellness Visit:  Age appropriate Health Maintenance and Prevention measures were discussed with patient. Included topics are cancer screening recommendations, ways to keep healthy (see AVS) including dietary and exercise recommendations, regular eye and dental care, use of seat belts, and avoidance of moderate alcohol use and tobacco use.   BMI: discussed patient's BMI and encouraged positive lifestyle modifications to help get to or maintain a target BMI.  HM needs and immunizations were addressed and ordered. See below for orders. See HM and immunization section for updates. Pneumovax and flu today.   Routine labs and screening tests ordered including cmp, cbc and lipids where appropriate.  Discussed recommendations regarding Vit D and calcium supplementation (see AVS)  Chronic disease f/u and/or acute problem visit: (deemed necessary to be done in addition to the wellness visit):  DM: well controlled now. Improve diet and encourage weight loss. Recheck 3 months. Needs eye exam. Check urine MAC ratio; not currently on ace.   HLD on statin: recheck to see if ldl at goal yet; monitoring lfts.   Overweight: to start eating more grains and veggies again.   Follow up: Return in about 3 months (around 01/03/2020) for follow up Diabetes.   Commons side effects, risks, benefits, and alternatives for medications and treatment plan prescribed today were discussed, and the patient expressed understanding of the given instructions. Patient is instructed to call or message via MyChart if he/she has any questions or concerns regarding our treatment plan. No barriers to understanding were identified. We discussed Red Flag symptoms and signs in detail. Patient expressed understanding regarding what to do in case of urgent or emergency type symptoms.   Medication list was reconciled, printed and provided to the patient in AVS. Patient  instructions and summary information was reviewed with the patient as documented in the AVS. This note was prepared with assistance of Dragon voice recognition software. Occasional wrong-word or sound-a-like substitutions may have occurred due to the inherent limitations of voice recognition software  This visit occurred during the SARS-CoV-2 public health emergency.  Safety protocols were in place, including screening questions prior to the visit, additional usage of staff PPE, and extensive cleaning of exam room while observing appropriate contact time as indicated for disinfecting solutions.   Orders Placed This Encounter  Procedures  . Flu Vaccine QUAD 36+ mos IM  . Pneumococcal polysaccharide vaccine 23-valent greater than or equal to 2yo subcutaneous/IM  . CBC w/Diff  . CMP  . Lipids  . Urine MAC  . A1C POCT   No  orders of the defined types were placed in this encounter.

## 2019-10-05 NOTE — Patient Instructions (Addendum)
Please return in 3 months to recheck your diabetes.   I will release your lab results to you on your MyChart account with further instructions. Please reply with any questions.  Your sugar test is up a little again. Start back on a healthy diet to keep it controlled.   Today you were given your flu and pneumovax vaccinations.   Please set up an appointment for a diabetic eye exam and have the results sent to me.   It was a pleasure meeting you today! Thank you for choosing [place] to meet your healthcare needs! I truly look forward to working with you. If you have any questions or concerns, please send me a message via Mychart or call the office at [phone].  Please do these things to maintain good health!   Exercise at least 30-45 minutes a day,  4-5 days a week.   Eat a low-fat diet with lots of fruits and vegetables, up to 7-9 servings per day.  Drink plenty of water daily. Try to drink 8 8oz glasses per day.  Seatbelts can save your life. Always wear your seatbelt.  Place Smoke Detectors on every level of your home and check batteries every year.  Eye Doctor - have an eye exam every 1-2 years  Safe sex - use condoms to protect yourself from STDs if you could be exposed to these types of infections.  Avoid heavy alcohol use. If you drink, keep it to less than 2 drinks/day and not every day.  [place].  Choose someone you trust that could speak for you if you became unable to speak for yourself.  Depression is common in our stressful world.If you're feeling down or losing interest in things you normally enjoy, please come in for a visit.

## 2019-12-15 ENCOUNTER — Other Ambulatory Visit: Payer: Self-pay

## 2019-12-15 DIAGNOSIS — E1169 Type 2 diabetes mellitus with other specified complication: Secondary | ICD-10-CM

## 2019-12-15 DIAGNOSIS — E785 Hyperlipidemia, unspecified: Secondary | ICD-10-CM

## 2019-12-15 MED ORDER — ATORVASTATIN CALCIUM 20 MG PO TABS: 20.0000 mg | ORAL_TABLET | Freq: Every day | ORAL | 3 refills | Status: DC

## 2020-01-06 ENCOUNTER — Other Ambulatory Visit: Payer: Self-pay

## 2020-01-06 ENCOUNTER — Encounter: Payer: Self-pay | Admitting: Family Medicine

## 2020-01-06 DIAGNOSIS — E1169 Type 2 diabetes mellitus with other specified complication: Secondary | ICD-10-CM

## 2020-01-06 MED ORDER — METFORMIN HCL ER 750 MG PO TB24: 750.0000 mg | ORAL_TABLET | Freq: Every day | ORAL | 3 refills | Status: DC

## 2020-01-06 NOTE — Telephone Encounter (Signed)
Patient has been scheduled for 01/14/20 and wants to know if his prescription could be sent in to at least till his appt.

## 2020-01-14 ENCOUNTER — Ambulatory Visit (INDEPENDENT_AMBULATORY_CARE_PROVIDER_SITE_OTHER): Payer: BC Managed Care – PPO | Admitting: Family Medicine

## 2020-01-14 ENCOUNTER — Encounter: Payer: Self-pay | Admitting: Family Medicine

## 2020-01-14 ENCOUNTER — Other Ambulatory Visit: Payer: Self-pay

## 2020-01-14 VITALS — BP 138/78 | HR 65 | Temp 97.7°F | Ht 66.0 in | Wt 180.2 lb

## 2020-01-14 DIAGNOSIS — E782 Mixed hyperlipidemia: Secondary | ICD-10-CM

## 2020-01-14 DIAGNOSIS — J309 Allergic rhinitis, unspecified: Secondary | ICD-10-CM | POA: Diagnosis not present

## 2020-01-14 DIAGNOSIS — E1169 Type 2 diabetes mellitus with other specified complication: Secondary | ICD-10-CM

## 2020-01-14 DIAGNOSIS — E663 Overweight: Secondary | ICD-10-CM | POA: Diagnosis not present

## 2020-01-14 DIAGNOSIS — E119 Type 2 diabetes mellitus without complications: Secondary | ICD-10-CM | POA: Diagnosis not present

## 2020-01-14 LAB — COMPREHENSIVE METABOLIC PANEL
ALT: 43 U/L (ref 0–53)
AST: 36 U/L (ref 0–37)
Albumin: 4.5 g/dL (ref 3.5–5.2)
Alkaline Phosphatase: 53 U/L (ref 39–117)
BUN: 12 mg/dL (ref 6–23)
CO2: 27 mEq/L (ref 19–32)
Calcium: 9.7 mg/dL (ref 8.4–10.5)
Chloride: 101 mEq/L (ref 96–112)
Creatinine, Ser: 0.94 mg/dL (ref 0.40–1.50)
GFR: 86.46 mL/min (ref >60.00)
Glucose, Bld: 163 mg/dL — ABNORMAL HIGH (ref 70–99)
Potassium: 4.6 mEq/L (ref 3.5–5.1)
Sodium: 136 mEq/L (ref 135–145)
Total Bilirubin: 1.5 mg/dL — ABNORMAL HIGH (ref 0.2–1.2)
Total Protein: 7.2 g/dL (ref 6.0–8.3)

## 2020-01-14 LAB — LIPID PANEL
Cholesterol: 165 mg/dL (ref 0–200)
HDL: 52.6 mg/dL (ref >39.00)
NonHDL: 112.18
Total CHOL/HDL Ratio: 3
Triglycerides: 210 mg/dL — ABNORMAL HIGH (ref 0.0–149.0)
VLDL: 42 mg/dL — ABNORMAL HIGH (ref 0.0–40.0)

## 2020-01-14 LAB — LDL CHOLESTEROL, DIRECT: Direct LDL: 89 mg/dL

## 2020-01-14 LAB — POCT GLYCOSYLATED HEMOGLOBIN (HGB A1C): Hemoglobin A1C: 7.3 % — AB (ref 4.0–5.6)

## 2020-01-14 MED ORDER — AZELASTINE HCL 0.1 % NA SOLN: 1.0000 | Freq: Two times a day (BID) | NASAL | 12 refills | Status: DC

## 2020-01-14 MED ORDER — FLUTICASONE PROPIONATE 50 MCG/ACT NA SUSP: 1.0000 | Freq: Every day | NASAL | 6 refills | Status: DC

## 2020-01-14 MED ORDER — FARXIGA 10 MG PO TABS: 10.0000 mg | ORAL_TABLET | Freq: Every day | ORAL | 3 refills | Status: DC

## 2020-01-14 NOTE — Progress Notes (Signed)
Subjective  CC:  Chief Complaint  Patient presents with  . Diabetes    takes metformin 750 mg daily. diet is good. not checking dm at home    HPI: Tim Nielsen is a 44 y.o. male who presents to the office today for follow up of diabetes and problems listed above in the chief complaint.   Diabetes follow up: His diabetic control is reported as Unchanged. Reports he eats well; not eating refined sugars/carbs. Weight is stable. On low dose metformin and tolerates well. No complications. He denies exertional CP or SOB or symptomatic hypoglycemia. He denies foot sores or paresthesias.   HLD: did not increase his lipitor;wants to see if it is improved with diet changes. Tolerates lipitor  Allergies: has had for years and OTC meds don't work.   Has questions about covid vaccination.  Wt Readings from Last 3 Encounters:  01/14/20 180 lb 3.2 oz (81.7 kg)  10/05/19 180 lb (81.6 kg)  12/01/18 174 lb 6.4 oz (79.1 kg)    BP Readings from Last 3 Encounters:  01/14/20 138/78  10/05/19 130/78  12/01/18 122/86    Assessment  1. Controlled type 2 diabetes mellitus without complication, without long-term current use of insulin ([facility])   2. Combined hyperlipidemia associated with type 2 diabetes mellitus ([place])   3. Overweight   4. Chronic allergic rhinitis      Plan   Diabetes is currently marginally controlled. Add farxiga; educated on use/benefits/risks. Continue metformin. Continue diabetic diet  HLD: recheck today and increase lipitor if not at goal. Pt agrees  Weight loss reocmmended  Recommend covid vaccination. Questions answered  [place]: start astelin, flonase and zyrtec. F/u if not improved. Educated on use  Follow up: 3 months DM and HLD. Orders Placed This Encounter  Procedures  . Comprehensive metabolic panel  . Lipid panel  . POCT glycosylated hemoglobin (Hb A1C)   Meds ordered this encounter  Medications  . azelastine (ASTELIN) 0.1 % nasal spray   Sig: Place 1 spray into both nostrils 2 (two) times daily. Use in each nostril as directed    Dispense:  30 mL    Refill:  12  . fluticasone (FLONASE) 50 MCG/ACT nasal spray    Sig: Place 1 spray into both nostrils daily.    Dispense:  16 g    Refill:  6  . dapagliflozin propanediol (FARXIGA) 10 MG TABS tablet    Sig: Take 10 mg by mouth daily.    Dispense:  90 tablet    Refill:  3      Immunization History  Administered Date(s) Administered  . Influenza,inj,Quad PF,6+ Mos 08/22/2017, 08/11/2018, 10/05/2019  . Pneumococcal Polysaccharide-23 10/05/2019  . Tdap 08/11/2018    Diabetes Related Lab Review: Lab Results  Component Value Date   HGBA1C 7.3 (A) 01/14/2020   HGBA1C 6.4 (A) 10/05/2019   HGBA1C 6.0 (A) 11/30/2018    Lab Results  Component Value Date   MICROALBUR 0.9 10/05/2019   Lab Results  Component Value Date   CREATININE 0.92 10/05/2019   BUN 14 10/05/2019   NA 136 10/05/2019   K 4.0 10/05/2019   CL 100 10/05/2019   CO2 29 10/05/2019   Lab Results  Component Value Date   CHOL 194 10/05/2019   CHOL 239 (H) 08/11/2018   CHOL 222 (H) 08/22/2017   Lab Results  Component Value Date   HDL 52.50 10/05/2019   HDL 54.70 08/11/2018   HDL 44.00 08/22/2017   No results found  for: [facility] Lab Results  Component Value Date   TRIG 315.0 (H) 10/05/2019   TRIG 259.0 (H) 08/11/2018   TRIG 324.0 (H) 08/22/2017   Lab Results  Component Value Date   CHOLHDL 4 10/05/2019   CHOLHDL 4 08/11/2018   CHOLHDL 5 08/22/2017   Lab Results  Component Value Date   LDLDIRECT 105.0 10/05/2019   LDLDIRECT 168.0 08/11/2018   LDLDIRECT 136.0 08/22/2017   The 10-year ASCVD risk score Denman George DC Jr., et al., 2013) is: 4%   Values used to calculate the score:     Age: 54 years     Sex: Male     Is Non-Hispanic African American: No     Diabetic: Yes     Tobacco smoker: No     Systolic Blood Pressure: 138 mmHg     Is BP treated: No     HDL Cholesterol: 52.5 mg/dL      Total Cholesterol: 194 mg/dL I have reviewed the PMH, Fam and Soc history. Patient Active Problem List   Diagnosis Date Noted  . Overweight 10/05/2019  . Combined hyperlipidemia associated with type 2 diabetes mellitus ([facility])   . Controlled type 2 diabetes mellitus without complication, without long-term current use of insulin ([facility])     a1c 7.0 2019     Social History: Patient  reports that he has quit smoking. He has quit using smokeless tobacco. He reports current alcohol use. He reports that he does not use drugs.  Review of Systems: Ophthalmic: negative for eye pain, loss of vision or double vision Cardiovascular: negative for chest pain Respiratory: negative for SOB or persistent cough Gastrointestinal: negative for abdominal pain Genitourinary: negative for dysuria or gross hematuria MSK: negative for foot lesions Neurologic: negative for weakness or gait disturbance  Objective  Vitals: BP 138/78 (BP Location: Right Arm, Patient Position: Sitting, Cuff Size: Normal)   Pulse 65   Temp 97.7 F (36.5 C) (Temporal)   Ht 5\' 6"  (1.676 m)   Wt 180 lb 3.2 oz (81.7 kg)   SpO2 99%   BMI 29.09 kg/m  General: well appearing, no acute distress  Psych:  Alert and oriented, normal mood and affect  Diabetic education: ongoing education regarding chronic disease management for diabetes was given today. We continue to reinforce the ABC's of diabetic management: A1c (<7 or 8 dependent upon patient), tight blood pressure control, and cholesterol management with goal LDL < 100 minimally. We discuss diet strategies, exercise recommendations, medication options and possible side effects. At each visit, we review recommended immunizations and preventive care recommendations for diabetics and stress that good diabetic control can prevent other problems. See below for this patient's data.    Commons side effects, risks, benefits, and alternatives for medications and treatment plan prescribed today  were discussed, and the patient expressed understanding of the given instructions. Patient is instructed to call or message via MyChart if he/she has any questions or concerns regarding our treatment plan. No barriers to understanding were identified. We discussed Red Flag symptoms and signs in detail. Patient expressed understanding regarding what to do in case of urgent or emergency type symptoms.   Medication list was reconciled, printed and provided to the patient in AVS. Patient instructions and summary information was reviewed with the patient as documented in the AVS. This note was prepared with assistance of Dragon voice recognition software. Occasional wrong-word or sound-a-like substitutions may have occurred due to the inherent limitations of voice recognition software  This visit occurred during  the SARS-CoV-2 public health emergency.  Safety protocols were in place, including screening questions prior to the visit, additional usage of staff PPE, and extensive cleaning of exam room while observing appropriate contact time as indicated for disinfecting solutions.

## 2020-01-14 NOTE — Patient Instructions (Signed)
Please return in 3 months for diabetes follow up  Please take the coupon to the store with the RX for Farxiga. Continue taking the metformin as well.  I will release your lab results to you on your MyChart account with further instructions. Please reply with any questions.  Start OTC zyrtec or allegra in addition to the 2 nasal sprays I have ordered for you.   If you have any questions or concerns, please don't hesitate to send me a message via MyChart or call the office at [phone]. Thank you for visiting with [place] today! It's our pleasure caring for you.  Dapagliflozin tablets What is this medicine? DAPAGLIFLOZIN (DAP a gli FLOE zin) controls blood sugar in people with diabetes. It is used with lifestyle changes like diet and exercise. It also treats heart failure. It may lower the need for treatment of heart failure in the hospital. This medicine may be used for other purposes; ask your health care provider or pharmacist if you have questions. COMMON BRAND NAME(S): Marcelline Deist What should I tell my health care provider before I take this medicine? They need to know if you have any of these conditions:  dehydration  diabetic ketoacidosis  diet low in salt  eating less due to illness, surgery, dieting, or any other reason  having surgery  history of pancreatitis or pancreas problems  history of yeast infection of the penis or vagina  if you often drink alcohol  infections in the bladder, kidneys, or urinary tract  kidney disease  low blood pressure  on hemodialysis  problems urinating  type 1 diabetes  uncircumcised male  an unusual or allergic reaction to dapagliflozin, other medicines, foods, dyes, or preservatives  pregnant or trying to get pregnant  breast-feeding How should I use this medicine? Take this medicine by mouth with a glass of water. Follow the directions on the prescription label. You can take it with or without food. If it upsets your stomach, take  it with food. Take this medicine in the morning. Take your dose at the same time each day. Do not take more often than directed. Do not stop taking except on your doctor's advice. A special MedGuide will be given to you by the pharmacist with each prescription and refill. Be sure to read this information carefully each time. Talk to your pediatrician regarding the use of this medicine in children. Special care may be needed. Overdosage: If you think you have taken too much of this medicine contact a poison control center or emergency room at once. NOTE: This medicine is only for you. Do not share this medicine with others. What if I miss a dose? If you miss a dose, take it as soon as you can. If it is almost time for your next dose, take only that dose. Do not take double or extra doses. What may interact with this medicine? Do not take this medicine with any of the following medications:  gatifloxacin This medicine may also interact with the following medications:  alcohol  certain medicines for blood pressure, heart disease  diuretics  insulin  nateglinide  pioglitazone  quinolone antibiotics like ciprofloxacin, levofloxacin, ofloxacin  repaglinide  some herbal dietary supplements  steroid medicines like prednisone or cortisone  sulfonylureas like glimepiride, glipizide, glyburide  thyroid medicine This list may not describe all possible interactions. Give your health care provider a list of all the medicines, herbs, non-prescription drugs, or dietary supplements you use. Also tell them if you smoke, drink alcohol, or  use illegal drugs. Some items may interact with your medicine. What should I watch for while using this medicine? Visit your doctor or health care professional for regular checks on your progress. This medicine can cause a serious condition in which there is too much acid in the blood. If you develop nausea, vomiting, stomach pain, unusual tiredness, or  breathing problems, stop taking this medicine and call your doctor right away. If possible, use a ketone dipstick to check for ketones in your urine. A test called the HbA1C (A1C) will be monitored. This is a simple blood test. It measures your blood sugar control over the last 2 to 3 months. You will receive this test every 3 to 6 months. Learn how to check your blood sugar. Learn the symptoms of low and high blood sugar and how to manage them. Always carry a quick-source of sugar with you in case you have symptoms of low blood sugar. Examples include hard sugar candy or glucose tablets. Make sure others know that you can choke if you eat or drink when you develop serious symptoms of low blood sugar, such as seizures or unconsciousness. They must get medical help at once. Tell your doctor or health care professional if you have high blood sugar. You might need to change the dose of your medicine. If you are sick or exercising more than usual, you might need to change the dose of your medicine. Do not skip meals. Ask your doctor or health care professional if you should avoid alcohol. Many nonprescription cough and cold products contain sugar or alcohol. These can affect blood sugar. Wear a medical ID bracelet or chain, and carry a card that describes your disease and details of your medicine and dosage times. What side effects may I notice from receiving this medicine? Side effects that you should report to your doctor or health care professional as soon as possible:  allergic reactions like skin rash, itching or hives, swelling of the face, lips, or tongue  breathing problems  dizziness  feeling faint or lightheaded, falls  muscle weakness  nausea, vomiting, unusual stomach upset or pain  new pain or tenderness, change in skin color, sores or ulcers, or infection in legs or feet  penile discharge, itching, or pain in men  signs and symptoms of a genital infection, such as fever;  tenderness, redness, or swelling in the genitals or area from the genitals to the back of the rectum  signs and symptoms of low blood sugar such as feeling anxious, confusion, dizziness, increased hunger, unusually weak or tired, sweating, shakiness, cold, irritable, headache, blurred vision, fast heartbeat, loss of consciousness  signs and symptoms of a urinary tract infection, such as fever, chills, a burning feeling when urinating, blood in the urine, back pain  trouble passing urine or change in the amount of urine, including an urgent need to urinate more often, in larger amounts, or at night  unusual tiredness  vaginal discharge, itching, or odor in women Side effects that usually do not require medical attention (report to your doctor or health care professional if they continue or are bothersome):  mild increase in urination  thirsty This list may not describe all possible side effects. Call your doctor for medical advice about side effects. You may report side effects to FDA at 1-800-FDA-1088. Where should I keep my medicine? Keep out of the reach of children. Store at room temperature between 15 and 30 degrees C (59 and 86 degrees F). Throw away any  unused medicine after the expiration date. NOTE: This sheet is a summary. It may not cover all possible information. If you have questions about this medicine, talk to your doctor, pharmacist, or health care provider.  2020 [employer]vier/Gold Standard (2019-03-04 18:58:14)

## 2020-02-01 ENCOUNTER — Other Ambulatory Visit: Payer: Self-pay

## 2020-02-01 DIAGNOSIS — E1169 Type 2 diabetes mellitus with other specified complication: Secondary | ICD-10-CM

## 2020-02-01 MED ORDER — ATORVASTATIN CALCIUM 20 MG PO TABS: 20.0000 mg | ORAL_TABLET | Freq: Every day | ORAL | 3 refills | Status: DC

## 2020-02-08 NOTE — Telephone Encounter (Signed)
Atorvastatin is lipitor. thanks

## 2021-02-03 ENCOUNTER — Other Ambulatory Visit: Payer: Self-pay | Admitting: Family Medicine

## 2021-02-03 DIAGNOSIS — E1169 Type 2 diabetes mellitus with other specified complication: Secondary | ICD-10-CM

## 2021-02-05 ENCOUNTER — Other Ambulatory Visit: Payer: Self-pay

## 2021-02-05 ENCOUNTER — Other Ambulatory Visit: Payer: Self-pay | Admitting: Family Medicine

## 2021-02-05 ENCOUNTER — Encounter: Payer: Self-pay | Admitting: Family Medicine

## 2021-02-05 DIAGNOSIS — E1169 Type 2 diabetes mellitus with other specified complication: Secondary | ICD-10-CM

## 2021-02-05 DIAGNOSIS — E785 Hyperlipidemia, unspecified: Secondary | ICD-10-CM

## 2021-02-05 MED ORDER — METFORMIN HCL ER 750 MG PO TB24: 750.0000 mg | ORAL_TABLET | Freq: Every day | ORAL | 0 refills | Status: DC

## 2021-02-05 MED ORDER — ATORVASTATIN CALCIUM 20 MG PO TABS: 20.0000 mg | ORAL_TABLET | Freq: Every day | ORAL | 0 refills | Status: DC

## 2021-02-07 ENCOUNTER — Ambulatory Visit (INDEPENDENT_AMBULATORY_CARE_PROVIDER_SITE_OTHER): Payer: 59 | Admitting: Family Medicine

## 2021-02-07 ENCOUNTER — Other Ambulatory Visit: Payer: Self-pay

## 2021-02-07 ENCOUNTER — Encounter: Payer: Self-pay | Admitting: Family Medicine

## 2021-02-07 VITALS — BP 118/70 | HR 77 | Temp 97.7°F | Wt 171.0 lb

## 2021-02-07 DIAGNOSIS — Z789 Other specified health status: Secondary | ICD-10-CM | POA: Diagnosis not present

## 2021-02-07 DIAGNOSIS — E1169 Type 2 diabetes mellitus with other specified complication: Secondary | ICD-10-CM | POA: Diagnosis not present

## 2021-02-07 DIAGNOSIS — F1090 Alcohol use, unspecified, uncomplicated: Secondary | ICD-10-CM

## 2021-02-07 DIAGNOSIS — E1165 Type 2 diabetes mellitus with hyperglycemia: Secondary | ICD-10-CM

## 2021-02-07 DIAGNOSIS — K219 Gastro-esophageal reflux disease without esophagitis: Secondary | ICD-10-CM

## 2021-02-07 DIAGNOSIS — Z0000 Encounter for general adult medical examination without abnormal findings: Secondary | ICD-10-CM

## 2021-02-07 DIAGNOSIS — E782 Mixed hyperlipidemia: Secondary | ICD-10-CM

## 2021-02-07 DIAGNOSIS — G90522 Complex regional pain syndrome I of left lower limb: Secondary | ICD-10-CM | POA: Diagnosis not present

## 2021-02-07 DIAGNOSIS — E785 Hyperlipidemia, unspecified: Secondary | ICD-10-CM

## 2021-02-07 LAB — CBC WITH DIFFERENTIAL/PLATELET
Basophils Absolute: 0 10*3/uL (ref 0.0–0.1)
Basophils Relative: 0.6 % (ref 0.0–3.0)
Eosinophils Absolute: 0.2 10*3/uL (ref 0.0–0.7)
Eosinophils Relative: 3.2 % (ref 0.0–5.0)
HCT: 43.5 % (ref 39.0–52.0)
Hemoglobin: 15.1 g/dL (ref 13.0–17.0)
Lymphocytes Relative: 43.3 % (ref 12.0–46.0)
Lymphs Abs: 2.8 10*3/uL (ref 0.7–4.0)
MCHC: 34.7 g/dL (ref 30.0–36.0)
MCV: 88 fl (ref 78.0–100.0)
Monocytes Absolute: 0.5 10*3/uL (ref 0.1–1.0)
Monocytes Relative: 8.6 % (ref 3.0–12.0)
Neutro Abs: 2.8 10*3/uL (ref 1.4–7.7)
Neutrophils Relative %: 44.3 % (ref 43.0–77.0)
Platelets: 174 10*3/uL (ref 150.0–400.0)
RBC: 4.95 Mil/uL (ref 4.22–5.81)
RDW: 12.6 % (ref 11.5–15.5)
WBC: 6.4 10*3/uL (ref 4.0–10.5)

## 2021-02-07 LAB — COMPREHENSIVE METABOLIC PANEL
ALT: 23 U/L (ref 0–53)
AST: 19 U/L (ref 0–37)
Albumin: 4.3 g/dL (ref 3.5–5.2)
Alkaline Phosphatase: 51 U/L (ref 39–117)
BUN: 13 mg/dL (ref 6–23)
CO2: 27 mEq/L (ref 19–32)
Calcium: 9.4 mg/dL (ref 8.4–10.5)
Chloride: 98 mEq/L (ref 96–112)
Creatinine, Ser: 0.83 mg/dL (ref 0.40–1.50)
GFR: 104.66 mL/min (ref >60.00)
Glucose, Bld: 193 mg/dL — ABNORMAL HIGH (ref 70–99)
Potassium: 4.2 mEq/L (ref 3.5–5.1)
Sodium: 135 mEq/L (ref 135–145)
Total Bilirubin: 1.3 mg/dL — ABNORMAL HIGH (ref 0.2–1.2)
Total Protein: 7.1 g/dL (ref 6.0–8.3)

## 2021-02-07 LAB — LIPID PANEL
Cholesterol: 215 mg/dL — ABNORMAL HIGH (ref 0–200)
HDL: 47 mg/dL (ref >39.00)
NonHDL: 168.37
Total CHOL/HDL Ratio: 5
Triglycerides: 330 mg/dL — ABNORMAL HIGH (ref 0.0–149.0)
VLDL: 66 mg/dL — ABNORMAL HIGH (ref 0.0–40.0)

## 2021-02-07 LAB — POCT GLYCOSYLATED HEMOGLOBIN (HGB A1C): Hemoglobin A1C: 10.5 % — AB (ref 4.0–5.6)

## 2021-02-07 LAB — MICROALBUMIN / CREATININE URINE RATIO
Creatinine,U: 141.3 mg/dL
Microalb Creat Ratio: 1.4 mg/g (ref 0.0–30.0)
Microalb, Ur: 2 mg/dL — ABNORMAL HIGH (ref 0.0–1.9)

## 2021-02-07 LAB — LDL CHOLESTEROL, DIRECT: Direct LDL: 117 mg/dL

## 2021-02-07 MED ORDER — BLOOD GLUCOSE METER KIT: PACK | 0 refills | Status: DC

## 2021-02-07 MED ORDER — DAPAGLIFLOZIN PROPANEDIOL 10 MG PO TABS: 10.0000 mg | ORAL_TABLET | Freq: Every day | ORAL | 3 refills | Status: DC

## 2021-02-07 MED ORDER — METFORMIN HCL ER 750 MG PO TB24: 750.0000 mg | ORAL_TABLET | Freq: Every day | ORAL | 3 refills | Status: DC

## 2021-02-07 MED ORDER — METFORMIN HCL 1000 MG PO TABS: 1000.0000 mg | ORAL_TABLET | Freq: Two times a day (BID) | ORAL | 3 refills | Status: DC

## 2021-02-07 MED ORDER — ATORVASTATIN CALCIUM 20 MG PO TABS: 20.0000 mg | ORAL_TABLET | Freq: Every day | ORAL | 3 refills | Status: DC

## 2021-02-07 MED ORDER — OMEPRAZOLE 20 MG PO CPDR: 20.0000 mg | DELAYED_RELEASE_CAPSULE | Freq: Every day | ORAL | 3 refills | Status: DC

## 2021-02-07 NOTE — Patient Instructions (Addendum)
Please return in 3 months for diabetes follow up  Please start checking your sugars in the morning before eating and 2 hours after a meal, intermittently. Fasting sugars goal < 130 consistently. 2 hours after eating goal < 160 consistently. A1c goal < 7.0  Please let me know if the farxiga coupon does not work or if the medication is unaffordable.  I have changed the metformin to  twice a day. Decrease drinking.  Take prilosec daily for the next 4-12 to help your stomach.    If you have any questions or concerns, please don't hesitate to send me a message via MyChart or call the office at [phone]. Thank you for visiting with [place] today! It's our pleasure caring for you.   Diabetes Mellitus and Standards of Medical Care Living with and managing diabetes (diabetes mellitus) can be complicated. Your diabetes treatment may be managed by a team of health care providers, including:  A physician who specializes in diabetes (endocrinologist). You might also have visits with a nurse practitioner or physician assistant.  Nurses.  A registered dietitian.  A certified diabetes care and education specialist.  An exercise specialist.  A pharmacist.  An eye doctor.  A foot specialist (podiatrist).  A dental care provider.  A primary care provider.  A mental health care provider. How to manage your diabetes You can do many things to successfully manage your diabetes. Your health care providers will follow guidelines to help you get the best quality of care. Here are general guidelines for your diabetes management plan. Your health care providers may give you more specific instructions. Physical exams When you are diagnosed with diabetes, and each year after that, your health care provider will ask about your medical and family history. You will have a physical exam, which may include:  Measuring your height, weight, and body mass index (BMI).  Checking your blood pressure.  This will be done at every routine medical visit. Your target blood pressure may vary depending on your medical conditions, your age, and other factors.  A thyroid exam.  A skin exam.  Screening for nerve damage (peripheral neuropathy). This may include checking the pulse in your legs and feet and the level of sensation in your hands and feet.  A foot exam to inspect the structure and skin of your feet, including checking for cuts, bruises, redness, blisters, sores, or other problems.  Screening for blood vessel (vascular) problems. This may include checking the pulse in your legs and feet and checking your temperature. Blood tests Depending on your treatment plan and your personal needs, you may have the following tests:  Hemoglobin A1C (HbA1C). This test provides information about blood sugar (glucose) control over the previous 2-3 months. It is used to adjust your treatment plan, if needed. This test will be done: ? At least 2 times a year, if you are meeting your treatment goals. ? 4 times a year, if you are not meeting your treatment goals or if your goals have changed.  Lipid testing, including total cholesterol, LDL and HDL cholesterol, and triglyceride levels. ? The goal for LDL is less than 100 mg/dL (5.5 mmol/L). If you are at high risk for complications, the goal is less than 70 mg/dL (3.9 mmol/L). ? The goal for HDL is 40 mg/dL (2.2 mmol/L) or higher for men, and 50 mg/dL (2.8 mmol/L) or higher for women. An HDL cholesterol of 60 mg/dL (3.3 mmol/L) or higher gives some protection against heart disease. ? The goal  for triglycerides is less than 150 mg/dL (8.3 mmol/L).  Liver function tests.  Kidney function tests.  Thyroid function tests.   Dental and eye exams  Visit your dentist two times a year.  If you have type 1 diabetes, your health care provider may recommend an eye exam within 5 years after you are diagnosed, and then once a year after your first exam. ? For  children with type 1 diabetes, the health care provider may recommend an eye exam when your child is age 47 or older and has had diabetes for 3-5 years. After the first exam, your child should get an eye exam once a year.  If you have type 2 diabetes, your health care provider may recommend an eye exam as soon as you are diagnosed, and then every 1-2 years after your first exam.   Immunizations  A yearly flu (influenza) vaccine is recommended annually for everyone 6 months or older. This is especially important if you have diabetes.  The pneumonia (pneumococcal) vaccine is recommended for everyone 2 years or older who has diabetes. If you are age 865 or older, you may get the pneumonia vaccine as a series of two separate shots.  The hepatitis B vaccine is recommended for adults shortly after being diagnosed with diabetes. Adults and children with diabetes should receive all other vaccines according to age-specific recommendations from the Centers for Disease Control and Prevention (CDC). Mental and emotional health Screening for symptoms of eating disorders, anxiety, and depression is recommended at the time of diagnosis and after as needed. If your screening shows that you have symptoms, you may need more evaluation. You may work with a mental health care provider. Follow these instructions at home: Treatment plan You will monitor your blood glucose levels and may give yourself insulin. Your treatment plan will be reviewed at every medical visit. You and your health care provider will discuss:  How you are taking your medicines, including insulin.  Any side effects you have.  Your blood glucose level target goals.  How often you monitor your blood glucose level.  Lifestyle habits, such as activity level and tobacco, alcohol, and substance use. Education Your health care provider will assess how well you are monitoring your blood glucose levels and whether you are taking your insulin and  medicines correctly. He or she may refer you to:  A certified diabetes care and education specialist to manage your diabetes throughout your life, starting at diagnosis.  A registered dietitian who can create and review your personal nutrition plan.  An exercise specialist who can discuss your activity level and exercise plan. General instructions  Take over-the-counter and prescription medicines only as told by your health care provider.  Keep all follow-up visits. This is important. Where to find support There are many diabetes support networks, including:  American Diabetes Association (ADA): diabetes.org  Defeat Diabetes Foundation: defeatdiabetes.org Where to find more information  American Diabetes Association (ADA): www.diabetes.org  Association of Diabetes Care & Education Specialists (ADCES): diabeteseducator.org  International Diabetes Federation (IDF): http://hill.biz/idf.org Summary  Managing diabetes (diabetes mellitus) can be complicated. Your diabetes treatment may be managed by a team of health care providers.  Your health care providers follow guidelines to help you get the best quality care.  You should have physical exams, blood tests, blood pressure monitoring, immunizations, and screening tests regularly. Stay updated on how to manage your diabetes.  Your health care providers may also give you more specific instructions based on your individual health.  This information is not intended to replace advice given to you by your health care provider. Make sure you discuss any questions you have with your health care provider. Document Revised: 04/20/2020 Document Reviewed: 04/20/2020 Elsevier Patient Education  2021 Elsevier Inc.   Blood Glucose Monitoring, Adult Monitoring your blood sugar (glucose) is an important part of managing your diabetes. Blood glucose monitoring involves checking your blood glucose as often as directed and keeping a log or record of your results  over time. Checking your blood glucose regularly and keeping a blood glucose log can:  Help you and your health care provider adjust your diabetes management plan as needed, including your medicines or insulin.  Help you understand how food, exercise, illnesses, and medicines affect your blood glucose.  Let you know what your blood glucose is at any time. You can quickly find out if you have low blood glucose (hypoglycemia) or high blood glucose (hyperglycemia). Your health care provider will set individualized treatment goals for you. Your goals will be based on your age, other medical conditions you have, and how you respond to diabetes treatment. Generally, the goal of treatment is to maintain the following blood glucose levels:  Before meals (preprandial): 80-130 mg/dL (2.6-8.3 mmol/L).  After meals (postprandial): below 180 mg/dL (10 mmol/L).  A1C level: less than 7%. Supplies needed:  Blood glucose meter.  Test strips for your meter. Each meter has its own strips. You must use the strips that came with your meter.  A needle to prick your finger (lancet). Do not use a lancet more than one time.  A device that holds the lancet (lancing device).  A journal or log book to write down your results. How to check your blood glucose Checking your blood glucose 1. Wash your hands for at least 20 seconds with soap and water. 2. Prick the side of your finger (not the tip) with the lancet. Do not use the same finger consecutively. 3. Gently rub the finger until a small drop of blood appears. 4. Follow instructions that come with your meter for inserting the test strip, applying blood to the strip, and using your blood glucose meter. 5. Write down your result and any notes in your log.   Using alternative sites Some meters allow you to use areas of your body other than your finger (alternative sites) to test your blood. The most common alternative sites are the forearm, the thigh, and the  palm of your hand. Alternative sites may not be as accurate as the fingers because blood flow is slower in those areas. This means that the result you get may be delayed, and it may be different from the result that you would get from your finger. Use the finger only, and do not use alternative sites, if:  You think you have hypoglycemia.  You sometimes do not know that your blood glucose is getting low (hypoglycemia unawareness). General tips and recommendations Blood glucose log  Every time you check your blood glucose, write down your result. Also write down any notes about things that may be affecting your blood glucose, such as your diet and exercise for the day. This information can help you and your health care provider: ? Look for patterns in your blood glucose over time. ? Adjust your diabetes management plan as needed.  Check if your meter allows you to download your records to a computer or if there is an app for the meter. Most glucose meters store a record of glucose  readings in the meter.   If you have type 1 diabetes:  Check your blood glucose 4 or more times a day if you are on intensive insulin therapy with multiple daily injections (MDI) or if you are using an insulin pump. Check your blood glucose: ? Before every meal and snack. ? Before bedtime.  Also check your blood glucose: ? If you have symptoms of hypoglycemia. ? After treating low blood glucose. ? Before doing activities that create a risk for injury, like driving or using machinery. ? Before and after exercise. ? Two hours after a meal. ? Occasionally between 2:00 a.m. and 3:00 a.m., as directed.  You may need to check your blood glucose more often, 6-10 times per day, if: ? You have diabetes that is not well controlled. ? You are ill. ? You have a history of severe hypoglycemia. ? You have hypoglycemia unawareness. If you have type 2 diabetes:  Check your blood glucose 2 or more times a day if you take  insulin or other diabetes medicines.  Check your blood glucose 4 or more times a day if you are on intensive insulin therapy. Occasionally, you may also need to check your glucose between 2:00 a.m. and 3:00 a.m., as directed.  Also check your blood glucose: ? Before and after exercise. ? Before doing activities that create a risk for injury, like driving or using machinery.  You may need to check your blood glucose more often if: ? Your medicine is being adjusted. ? Your diabetes is not well controlled. ? You are ill. General tips  Make sure you always have your supplies with you.  After you use a few boxes of test strips, adjust (calibrate) your blood glucose meter by following instructions that came with your meter.  If you have questions or need help, all blood glucose meters have a 24-hour hotline phone number available that you can call. Also contact your health care provider with questions or concerns you may have. Where to find more information  The American Diabetes Association: www.diabetes.org  The Association of Diabetes Care & Education Specialists: www.diabeteseducator.org Contact a health care provider if:  Your blood glucose is at or above 240 mg/dL (16.1 mmol/L) for 2 days in a row.  You have been sick or have had a fever for 2 days or longer, and you are not getting better.  You have any of the following problems for more than 6 hours: ? You cannot eat or drink. ? You have nausea or vomiting. ? You have diarrhea. Get help right away if:  Your blood glucose is lower than 54 mg/dL (3 mmol/L).  You become confused, or you have trouble thinking clearly.  You have difficulty breathing.  You have moderate or large ketone levels in your urine. These symptoms may represent a serious problem that is an emergency. Do not wait to see if the symptoms will go away. Get medical help right away. Call your local emergency services (911 in the [place] Do not drive yourself  to the hospital. Summary  Monitoring your blood glucose is an important part of managing your diabetes.  Blood glucose monitoring involves checking your blood glucose as often as directed and keeping a log or record of your results over time.  Your health care provider will set individualized treatment goals for you. Your goals will be based on your age, other medical conditions you have, and how you respond to diabetes treatment.  Every time you check your blood glucose,  write down your result. Also, write down any notes about things that may be affecting your blood glucose, such as your diet and exercise for the day. This information is not intended to replace advice given to you by your health care provider. Make sure you discuss any questions you have with your health care provider. Document Revised: 07/12/2020 Document Reviewed: 07/12/2020 Elsevier Patient Education  2021 Elsevier Inc.   Alcohol Abuse and Dependence Information, Adult Alcohol is a widely available drug. People drink alcohol in different amounts. People who drink alcohol very often and in large amounts often have problems during and after drinking. They may develop what is called an alcohol use disorder. There are two main types of alcohol use disorders:  Alcohol abuse. This is when you use alcohol too much or too often. You may use alcohol to make yourself feel happy or to reduce stress. You may have a hard time setting a limit on the amount you drink.  Alcohol dependence. This is when you use alcohol consistently for a period of time, and your body changes as a result. This can make it hard to stop drinking because you may start to feel sick or feel different when you do not use alcohol. These symptoms are known as withdrawal. How can alcohol abuse and dependence affect me? Alcohol abuse and dependence can have a negative effect on your life. Drinking too much can lead to addiction. You may feel like you need alcohol to  function normally. You may drink alcohol before work in the morning, during the day, or as soon as you get home from work in the evening. These actions can result in:  Poor work performance.  Job loss.  Financial problems.  Car crashes or criminal charges from driving after drinking alcohol.  Problems in your relationships with friends and family.  Losing the trust and respect of coworkers, friends, and family. Drinking heavily over a long period of time can permanently damage your body and brain, and can cause lifelong health issues, such as:  Damage to your liver or pancreas.  Heart problems, high blood pressure, or stroke.  Certain cancers.  Decreased ability to fight infections.  Brain or nerve damage.  Depression.  Early (premature) death. If you are careless or you crave alcohol, it is easy to drink more than your body can handle (overdose). Alcohol overdose is a serious situation that requires hospitalization. It may lead to permanent injuries or death. What can increase my risk?  Having a family history of alcohol abuse.  Having depression or other mental health conditions.  Beginning to drink at an early age.  Binge drinking often.  Experiencing trauma, stress, and an unstable home life during childhood.  Spending time with people who drink often. What actions can I take to prevent or manage alcohol abuse and dependence?  Do not drink alcohol if: ? Your health care provider tells you not to drink. ? You are pregnant, may be pregnant, or are planning to become pregnant.  If you drink alcohol: ? Limit how much you use to:  0-1 drink a day for women.  0-2 drinks a day for men. ? Be aware of how much alcohol is in your drink. In the [place] one drink equals one 12 oz bottle of beer (355 mL), one 5 oz glass of wine (148 mL), or one 1 oz glass of hard liquor (44 mL).  Stop drinking if you have been drinking too much. This can be very hard to  do if you are  used to abusing alcohol. If you begin to have withdrawal symptoms, talk with your health care provider or a person that you trust. These symptoms may include anxiety, shaky hands, headache, nausea, sweating, or not being able to sleep.  Choose to drink nonalcoholic beverages in social gatherings and places where there may be alcohol. Activity  Spend more time on activities that you enjoy that do not involve alcohol, like hobbies or exercise.  Find healthy ways to cope with stress, such as exercise, meditation, or spending time with people you care about. General information  Talk to your family, coworkers, and friends about supporting you in your efforts to stop drinking. If they drink, ask them not to drink around you. Spend more time with people who do not drink alcohol.  If you think that you have an alcohol dependency problem: ? Tell friends or family about your concerns. ? Talk with your health care provider or another health professional about where to get help. ? Work with a Paramedic and a Network engineer. ? Consider joining a support group for people who struggle with alcohol abuse and dependence. Where to find support  Your health care provider.  SMART Recovery: www.smartrecovery.org Therapy and support groups  Local treatment centers or chemical dependency counselors.  Local AA groups in your community: SalaryStart.tn   Where to find more information  Centers for Disease Control and Prevention: FootballExhibition.com.br  [employer] on Alcohol Abuse and Alcoholism: BasicStudents.dk  Alcoholics Anonymous (AA): SalaryStart.tn Contact a health care provider if:  You drank more or for longer than you intended on more than one occasion.  You tried to stop drinking or to cut back on how much you drink, but you were not able to.  You often drink to the point of vomiting or passing out.  You want to drink so badly that you cannot think about anything else.  You have  problems in your life due to drinking, but you continue to drink.  You keep drinking even though you feel anxious, depressed, or have experienced memory loss.  You have stopped doing the things you used to enjoy in order to drink.  You have to drink more than you used to in order to get the effect you want.  You experience anxiety, sweating, nausea, shakiness, and trouble sleeping when you try to stop drinking. Get help right away if:  You have thoughts about hurting yourself or others.  You have serious withdrawal symptoms, including: ? Confusion. ? Racing heart. ? High blood pressure. ? Fever. If you ever feel like you may hurt yourself or others, or have thoughts about taking your own life, get help right away. You can go to your nearest emergency department or call:  Your local emergency services (911 in the [place]  A suicide crisis helpline, such as the National Suicide Prevention Lifeline at 249-139-5606. This is open 24 hours a day. Summary  Alcohol abuse and dependence can have a negative effect on your life. Drinking too much or too often can lead to addiction.  If you drink alcohol, limit how much you use.  If you are having trouble keeping your drinking under control, find ways to change your behavior. Hobbies, calming activities, exercise, or support groups can help.  If you feel you need help with changing your drinking habits, talk with your health care provider, a good friend, or a therapist, or go to an AA group. This information is not intended to replace  advice given to you by your health care provider. Make sure you discuss any questions you have with your health care provider. Document Revised: 02/02/2019 Document Reviewed: 12/22/2018 Elsevier Patient Education  2021 [employer].

## 2021-02-07 NOTE — Progress Notes (Signed)
Subjective  Chief Complaint  Patient presents with  . Annual Exam    No food, had black tea no sugar.  . Diabetes  . Hyperlipidemia  . Hypertension    HPI: Tim Nielsen is a 45 y.o. male who presents to [facility] [place] today for a Male Wellness Visit. He also has the concerns and/or needs as listed above in the chief complaint. These will be addressed in addition to the Health Maintenance Visit.   Wellness Visit: annual visit with health maintenance review and exam    45 yo with diabetes who was last seen > 1 year ago.   ZG:YFVCBSW are up to date. imms are up to date.   Body mass index is 27.6 kg/m. Wt Readings from Last 3 Encounters:  02/07/21 171 lb (77.6 kg)  01/14/20 180 lb 3.2 oz (81.7 kg)  10/05/19 180 lb (81.6 kg)     Chronic disease management visit and/or acute problem visit:  DM: farxiga was initiated in 2020 but cost was high so he stopped it. Now on metformin xr 750 daily and reports he feels well. Admits to occasional blurred vision but no other sxs of hyperglycemia. He doesn't check his sugars. Diet is healthy however he drinks alcohol daily. Binge drinking on weekends: socially. Denies stressors or mood problems.   Daily alcohol: some excessive drinking. Endorses black outs and can't remember events from night before at times. Denies problems with friends or family. Neg CAGE questions. No w/d sxs. Feels he could drink less if he wanted.   + reflux w/o chronic pain or melena or n/v. Nl appetite.   HLD - is does take his statin. Due for lipid check. ldl goal < 70  CRS of left ankle; stable   Patient Active Problem List   Diagnosis Date Noted  . Uncontrolled type 2 diabetes mellitus with hyperglycemia (HCC) 02/07/2021  . Gastroesophageal reflux disease 02/07/2021  . Complex regional pain syndrome type 1 of left lower extremity 02/07/2021  . Heavy alcohol consumption 02/07/2021  . Overweight 10/05/2019  . Combined  hyperlipidemia associated with type 2 diabetes mellitus [facility])    Health Maintenance  Topic Date Due  . COVID-19 Vaccine (3 - Booster for Pfizer series) 05/18/2021  . INFLUENZA VACCINE  05/28/2021  . OPHTHALMOLOGY EXAM  07/28/2021  . HEMOGLOBIN A1C  08/09/2021  . FOOT EXAM  02/07/2022  . URINE MICROALBUMIN  02/07/2022  . COLONOSCOPY (Pts 45-24yrs Insurance coverage will need to be confirmed)  10/28/2022  . TETANUS/TDAP  08/11/2028  . PNEUMOCOCCAL POLYSACCHARIDE VACCINE AGE 24-64 HIGH RISK  Completed  . Hepatitis C Screening  Completed  . HIV Screening  Completed  . HPV VACCINES  Aged Out   Immunization History  Administered Date(s) Administered  . Influenza,inj,Quad PF,6+ Mos 08/22/2017, 08/11/2018, 10/05/2019  . PFIZER(Purple Top)SARS-COV-2 Vaccination 10/10/2020, 11/18/2020  . Pneumococcal Polysaccharide-23 10/05/2019  . Tdap 08/11/2018   We updated and reviewed the patient's past history in detail and it is documented below. Allergies: Patient has No Known Allergies. Past Medical History  has a past medical history of Complex regional pain syndrome of left lower extremity, Diabetes mellitus without complication (HCC), History of colonoscopy (2014), and HLD (hyperlipidemia). Past Surgical History Patient  has a past surgical history that includes ORIF ankle fracture (Left). Social History Patient  reports that he has quit smoking. He has quit using smokeless tobacco. He reports current alcohol use. He reports that he does not use drugs. Family History family  history includes Diabetes in his father and mother; Healthy in his daughter and daughter; Hyperlipidemia in his father and mother; Hypertension in his father and mother. Review of Systems: Constitutional: negative for fever or malaise Ophthalmic: negative for photophobia, double vision or loss of vision Cardiovascular: negative for chest pain, dyspnea on exertion, or new LE swelling Respiratory: negative for SOB or  persistent cough Gastrointestinal: negative for abdominal pain, change in bowel habits or melena Genitourinary: negative for dysuria or gross hematuria Musculoskeletal: negative for new gait disturbance or muscular weakness Integumentary: negative for new or persistent rashes Neurological: negative for TIA or stroke symptoms Psychiatric: negative for SI or delusions Allergic/Immunologic: negative for hives  Patient Care Team    Relationship Specialty Notifications Start End  Willow Ora, MD PCP - General Family Medicine  10/05/19    Objective  Vitals: BP 118/70 (BP Location: Left Arm, Patient Position: Sitting, Cuff Size: Large)   Pulse 77   Temp 97.7 F (36.5 C) (Temporal)   Wt 171 lb (77.6 kg)   SpO2 98%   BMI 27.60 kg/m  General:  Well developed, well nourished, no acute distress  Psych:  Alert and orientedx3,normal mood and affect HEENT:  Normocephalic, atraumatic, non-icteric sclera, PERRL, oropharynx is clear without mass or exudate, supple neck without adenopathy, mass or thyromegaly Cardiovascular:  Normal S1, S2, RRR without gallop, rub or murmur, nondisplaced PMI, +2 distal pulses in bilateral upper and lower extremities. Respiratory:  Good breath sounds bilaterally, CTAB with normal respiratory effort Gastrointestinal: normal bowel sounds, soft, non-tender, no noted masses. No HSM MSK: no deformities, contusions. Joints are without erythema or swelling. Spine and CVA region are nontender Skin:  Warm, no rashes or suspicious lesions noted Neurologic:    Mental status is normal. CN 2-11 are normal. Gross motor and sensory exams are normal. Stable gait. No tremor GU: No inguinal hernias or adenopathy are appreciated bilaterally Diabetic Foot Exam: Appearance - no lesions, ulcers or calluses Skin - no sigificant pallor or erythema Monofilament testing - sensitive bilaterally in following locations:  Right - Great toe, medial, central, lateral ball and posterior foot  intact  Left - Great toe, medial, central, lateral ball and posterior foot intact Pulses - +2 distally bilaterally    Assessment  1. Annual physical exam   2. Uncontrolled type 2 diabetes mellitus with hyperglycemia (HCC)   3. Combined hyperlipidemia associated with type 2 diabetes mellitus (HCC)   4. Heavy alcohol consumption   5. Complex regional pain syndrome type 1 of left lower extremity   6. Gastroesophageal reflux disease, unspecified whether esophagitis present      Plan  Male Wellness Visit:  Age appropriate Health Maintenance and Prevention measures were discussed with patient. Included topics are cancer screening recommendations, ways to keep healthy (see AVS) including dietary and exercise recommendations, regular eye and dental care, use of seat belts, and avoidance of moderate alcohol use and tobacco use.   BMI: discussed patient's BMI and encouraged positive lifestyle modifications to help get to or maintain a target BMI.  HM needs and immunizations were addressed and ordered. See below for orders. See HM and immunization section for updates.  Routine labs and screening tests ordered including cmp, cbc and lipids where appropriate.  Discussed recommendations regarding Vit D and calcium supplementation (see AVS)  Chronic disease f/u and/or acute problem visit: (deemed necessary to be done in addition to the wellness visit):  Uncontrolled diabetes: educations given. Defers nutrition consult. Needs to decrease alcohol intake due  to heavy carb load and overuse. Will try to restart farxiga. Increase metformin and receck in 6 months. Check urine; he is not on an ace. He is normotensive. imms and eye exam are up to date.   Lipids recheck today. Adjust statin if not at goal  Gerd, alcohol induced: start PPI  Alcohol overuse: counseling done. Decrease frequency and amount see avs  CRP: stable.   Follow up: 3 months for diabetes recheck.    Commons side effects, risks,  benefits, and alternatives for medications and treatment plan prescribed today were discussed, and the patient expressed understanding of the given instructions. Patient is instructed to call or message via MyChart if he/she has any questions or concerns regarding our treatment plan. No barriers to understanding were identified. We discussed Red Flag symptoms and signs in detail. Patient expressed understanding regarding what to do in case of urgent or emergency type symptoms.   Medication list was reconciled, printed and provided to the patient in AVS. Patient instructions and summary information was reviewed with the patient as documented in the AVS. This note was prepared with assistance of Dragon voice recognition software. Occasional wrong-word or sound-a-like substitutions may have occurred due to the inherent limitations of voice recognition software  This visit occurred during the SARS-CoV-2 public health emergency.  Safety protocols were in place, including screening questions prior to the visit, additional usage of staff PPE, and extensive cleaning of exam room while observing appropriate contact time as indicated for disinfecting solutions.   Orders Placed This Encounter  Procedures  . CBC with Differential/Platelet  . Comprehensive metabolic panel  . Lipid panel  . Microalbumin / creatinine urine ratio  . Hepatitis C antibody  . LDL cholesterol, direct  . POCT HgB A1C   Meds ordered this encounter  Medications  . omeprazole (PRILOSEC) 20 MG capsule    Sig: Take 1 capsule (20 mg total) by mouth daily.    Dispense:  30 capsule    Refill:  3  . metFORMIN (GLUCOPHAGE) 1000 MG tablet    Sig: Take 1 tablet (1,000 mg total) by mouth 2 (two) times daily with a meal.    Dispense:  180 tablet    Refill:  3    Please discontinue the glucophage xr 750 daily refills  . dapagliflozin propanediol (FARXIGA) 10 MG TABS tablet    Sig: Take 1 tablet (10 mg total) by mouth daily.    Dispense:   90 tablet    Refill:  3

## 2021-02-08 LAB — HEPATITIS C ANTIBODY
Hepatitis C Ab: NONREACTIVE
SIGNAL TO CUT-OFF: 0 (ref <1.00)

## 2021-02-13 ENCOUNTER — Other Ambulatory Visit: Payer: Self-pay

## 2021-02-13 MED ORDER — ATORVASTATIN CALCIUM 40 MG PO TABS: 40.0000 mg | ORAL_TABLET | Freq: Every day | ORAL | 3 refills | Status: DC

## 2021-03-27 ENCOUNTER — Other Ambulatory Visit: Payer: Self-pay | Admitting: Family Medicine

## 2021-05-09 ENCOUNTER — Ambulatory Visit: Payer: 59 | Admitting: Family Medicine

## 2021-05-16 ENCOUNTER — Ambulatory Visit (INDEPENDENT_AMBULATORY_CARE_PROVIDER_SITE_OTHER): Payer: 59 | Admitting: Family Medicine

## 2021-05-16 ENCOUNTER — Encounter: Payer: Self-pay | Admitting: Family Medicine

## 2021-05-16 ENCOUNTER — Other Ambulatory Visit: Payer: Self-pay

## 2021-05-16 VITALS — BP 146/103 | HR 69 | Temp 97.9°F | Wt 175.2 lb

## 2021-05-16 DIAGNOSIS — E782 Mixed hyperlipidemia: Secondary | ICD-10-CM

## 2021-05-16 DIAGNOSIS — E1165 Type 2 diabetes mellitus with hyperglycemia: Secondary | ICD-10-CM | POA: Diagnosis not present

## 2021-05-16 DIAGNOSIS — R809 Proteinuria, unspecified: Secondary | ICD-10-CM

## 2021-05-16 DIAGNOSIS — E1169 Type 2 diabetes mellitus with other specified complication: Secondary | ICD-10-CM | POA: Diagnosis not present

## 2021-05-16 DIAGNOSIS — E1129 Type 2 diabetes mellitus with other diabetic kidney complication: Secondary | ICD-10-CM

## 2021-05-16 DIAGNOSIS — Z789 Other specified health status: Secondary | ICD-10-CM | POA: Diagnosis not present

## 2021-05-16 DIAGNOSIS — F1090 Alcohol use, unspecified, uncomplicated: Secondary | ICD-10-CM

## 2021-05-16 LAB — LIPID PANEL
Cholesterol: 157 mg/dL (ref 0–200)
HDL: 57.6 mg/dL (ref >39.00)
NonHDL: 99.31
Total CHOL/HDL Ratio: 3
Triglycerides: 342 mg/dL — ABNORMAL HIGH (ref 0.0–149.0)
VLDL: 68.4 mg/dL — ABNORMAL HIGH (ref 0.0–40.0)

## 2021-05-16 LAB — COMPREHENSIVE METABOLIC PANEL
ALT: 32 U/L (ref 0–53)
AST: 36 U/L (ref 0–37)
Albumin: 4.3 g/dL (ref 3.5–5.2)
Alkaline Phosphatase: 52 U/L (ref 39–117)
BUN: 15 mg/dL (ref 6–23)
CO2: 27 mEq/L (ref 19–32)
Calcium: 9 mg/dL (ref 8.4–10.5)
Chloride: 99 mEq/L (ref 96–112)
Creatinine, Ser: 0.78 mg/dL (ref 0.40–1.50)
GFR: 106.45 mL/min (ref >60.00)
Glucose, Bld: 145 mg/dL — ABNORMAL HIGH (ref 70–99)
Potassium: 3.8 mEq/L (ref 3.5–5.1)
Sodium: 136 mEq/L (ref 135–145)
Total Bilirubin: 1.3 mg/dL — ABNORMAL HIGH (ref 0.2–1.2)
Total Protein: 6.9 g/dL (ref 6.0–8.3)

## 2021-05-16 LAB — LDL CHOLESTEROL, DIRECT: Direct LDL: 64 mg/dL

## 2021-05-16 LAB — POCT GLYCOSYLATED HEMOGLOBIN (HGB A1C): Hemoglobin A1C: 7.6 % — AB (ref 4.0–5.6)

## 2021-05-16 MED ORDER — LISINOPRIL 5 MG PO TABS: 5.0000 mg | ORAL_TABLET | Freq: Every day | ORAL | 3 refills | Status: DC

## 2021-05-16 MED ORDER — GLIPIZIDE ER 10 MG PO TB24: 10.0000 mg | ORAL_TABLET | Freq: Every day | ORAL | 3 refills | Status: DC

## 2021-05-16 NOTE — Patient Instructions (Signed)
Please return in 3 months for diabetes follow up   Your sugars are improving.  I've switched out the farxgia with gluctrol XL once a day. This should be very afordable and works by increasing the insulin in your body to lower sugars. Skipping meals can cause low blood sugars. Be aware. Continue the metformin.   I will release your lab results to you on your MyChart account with further instructions. Please reply with any questions.    Lisinopril will bring down blood pressure and protect the kidneys. Let me know if you notice a cough or lightheadedness with this.   If you have any questions or concerns, please don't hesitate to send me a message via MyChart or call the office at [phone]. Thank you for visiting with [place] today! It's our pleasure caring for you.

## 2021-05-16 NOTE — Progress Notes (Signed)
Subjective  CC:  Chief Complaint  Patient presents with   Diabetes    Wanting to discuss other options than Farxiga - cost too much     HPI: Tim Nielsen is a 45 y.o. male who presents to the office today for follow up of diabetes and problems listed above in the chief complaint.  Diabetes follow up: His diabetic control is reported as Improved. Checking fastings: 130s-160s. Taking farxiga q 2-3 days due to high cost (300/mo). Eating better and notes sugars improve with increase exercise. Drinking is same.  He denies exertional CP or SOB or symptomatic hypoglycemia. He denies foot sores or paresthesias. + microalbuminuria with nl ratio. Not yet on ace. HLD: tolerating increased dose of statin. Due for recheck. He is fasting.  Elevated bp: likely due to lack of sleep. No h/o HTN> (stress, out of town guests, travel).  Wt Readings from Last 3 Encounters:  05/16/21 175 lb 3.2 oz (79.5 kg)  02/07/21 171 lb (77.6 kg)  01/14/20 180 lb 3.2 oz (81.7 kg)    BP Readings from Last 3 Encounters:  05/16/21 (!) 146/103  02/07/21 118/70  01/14/20 138/78    Assessment  1. Uncontrolled type 2 diabetes mellitus with hyperglycemia ([place])   2. Combined hyperlipidemia associated with type 2 diabetes mellitus ([place])   3. Microalbuminuria due to type 2 diabetes mellitus ([place])   4. Heavy alcohol consumption      Plan  Diabetes is currently marginally controlled. Much improved. Continue diet changes exercise and switch to glucotrol xl with met. Will work on getting sglt-2i in future. Cost prohibitive right now.  HLD: recheck lipids on lipitor 40 Monitor BP. Start lisinopril 5; renal protective as well. See avs. Rec decreasing alcohol.    Follow up: 3 mo for recheck. . Orders Placed This Encounter  Procedures   Comprehensive metabolic panel   Lipid panel   POCT HgB A1C   Meds ordered this encounter  Medications   glipiZIDE (GLUCOTROL XL) 10 MG 24 hr tablet    Sig: Take 1 tablet  (10 mg total) by mouth daily with breakfast.    Dispense:  90 tablet    Refill:  3   lisinopril (ZESTRIL) 5 MG tablet    Sig: Take 1 tablet (5 mg total) by mouth daily.    Dispense:  90 tablet    Refill:  3      Immunization History  Administered Date(s) Administered   Influenza,inj,Quad PF,6+ Mos 08/22/2017, 08/11/2018, 10/05/2019   PFIZER(Purple Top)SARS-COV-2 Vaccination 10/10/2020, 11/18/2020   Pneumococcal Polysaccharide-23 10/05/2019   Tdap 08/11/2018    Diabetes Related Lab Review: Lab Results  Component Value Date   HGBA1C 7.6 (A) 05/16/2021   HGBA1C 10.5 (A) 02/07/2021   HGBA1C 7.3 (A) 01/14/2020    Lab Results  Component Value Date   MICROALBUR 2.0 (H) 02/07/2021   Lab Results  Component Value Date   CREATININE 0.83 02/07/2021   BUN 13 02/07/2021   NA 135 02/07/2021   K 4.2 02/07/2021   CL 98 02/07/2021   CO2 27 02/07/2021   Lab Results  Component Value Date   CHOL 215 (H) 02/07/2021   CHOL 165 01/14/2020   CHOL 194 10/05/2019   Lab Results  Component Value Date   HDL 47.00 02/07/2021   HDL 52.60 01/14/2020   HDL 52.50 10/05/2019   No results found for: [facility] Lab Results  Component Value Date   TRIG 330.0 (H) 02/07/2021   TRIG 210.0 (H) 01/14/2020  TRIG 315.0 (H) 10/05/2019   Lab Results  Component Value Date   CHOLHDL 5 02/07/2021   CHOLHDL 3 01/14/2020   CHOLHDL 4 10/05/2019   Lab Results  Component Value Date   LDLDIRECT 117.0 02/07/2021   LDLDIRECT 89.0 01/14/2020   LDLDIRECT 105.0 10/05/2019   The 10-year ASCVD risk score Mikey Bussing DC Jr., et al., 2013) is: 7.3%   Values used to calculate the score:     Age: 24 years     Sex: Male     Is Non-Hispanic African American: No     Diabetic: Yes     Tobacco smoker: No     Systolic Blood Pressure: 734 mmHg     Is BP treated: No     HDL Cholesterol: 47 mg/dL     Total Cholesterol: 215 mg/dL I have reviewed the PMH, Fam and Soc history. Patient Active Problem List   Diagnosis Date  Noted   Uncontrolled type 2 diabetes mellitus with hyperglycemia ([place]) 02/07/2021   Gastroesophageal reflux disease 02/07/2021   Complex regional pain syndrome type 1 of left lower extremity 02/07/2021   Heavy alcohol consumption 02/07/2021   Overweight 10/05/2019   Combined hyperlipidemia associated with type 2 diabetes mellitus ([place])     Social History: Patient  reports that he has quit smoking. He has quit using smokeless tobacco. He reports current alcohol use. He reports that he does not use drugs.  Review of Systems: Ophthalmic: negative for eye pain, loss of vision or double vision Cardiovascular: negative for chest pain Respiratory: negative for SOB or persistent cough Gastrointestinal: negative for abdominal pain Genitourinary: negative for dysuria or gross hematuria MSK: negative for foot lesions Neurologic: negative for weakness or gait disturbance  Objective  Vitals: BP (!) 146/103   Pulse 69   Temp 97.9 F (36.6 C) (Temporal)   Wt 175 lb 3.2 oz (79.5 kg)   SpO2 99%   BMI 28.28 kg/m  General: well appearing, no acute distress  Psych:  Alert and oriented, normal mood and affect     Diabetic education: ongoing education regarding chronic disease management for diabetes was given today. We continue to reinforce the ABC's of diabetic management: A1c (<7 or 8 dependent upon patient), tight blood pressure control, and cholesterol management with goal LDL < 100 minimally. We discuss diet strategies, exercise recommendations, medication options and possible side effects. At each visit, we review recommended immunizations and preventive care recommendations for diabetics and stress that good diabetic control can prevent other problems. See below for this patient's data.   Commons side effects, risks, benefits, and alternatives for medications and treatment plan prescribed today were discussed, and the patient expressed understanding of the given instructions. Patient is  instructed to call or message via MyChart if he/she has any questions or concerns regarding our treatment plan. No barriers to understanding were identified. We discussed Red Flag symptoms and signs in detail. Patient expressed understanding regarding what to do in case of urgent or emergency type symptoms.  Medication list was reconciled, printed and provided to the patient in AVS. Patient instructions and summary information was reviewed with the patient as documented in the AVS. This note was prepared with assistance of Dragon voice recognition software. Occasional wrong-word or sound-a-like substitutions may have occurred due to the inherent limitations of voice recognition software  This visit occurred during the SARS-CoV-2 public health emergency.  Safety protocols were in place, including screening questions prior to the visit, additional usage of staff PPE, and extensive  cleaning of exam room while observing appropriate contact time as indicated for disinfecting solutions.

## 2021-08-16 ENCOUNTER — Ambulatory Visit: Payer: 59 | Admitting: Family Medicine

## 2021-10-15 ENCOUNTER — Ambulatory Visit: Payer: 59 | Admitting: Family Medicine

## 2021-12-14 ENCOUNTER — Ambulatory Visit (INDEPENDENT_AMBULATORY_CARE_PROVIDER_SITE_OTHER): Payer: 59 | Admitting: Family Medicine

## 2021-12-14 ENCOUNTER — Encounter: Payer: Self-pay | Admitting: Family Medicine

## 2021-12-14 ENCOUNTER — Other Ambulatory Visit: Payer: Self-pay

## 2021-12-14 VITALS — BP 124/89 | HR 70 | Temp 98.0°F | Ht 66.0 in | Wt 180.6 lb

## 2021-12-14 DIAGNOSIS — M7711 Lateral epicondylitis, right elbow: Secondary | ICD-10-CM

## 2021-12-14 DIAGNOSIS — E1165 Type 2 diabetes mellitus with hyperglycemia: Secondary | ICD-10-CM | POA: Diagnosis not present

## 2021-12-14 DIAGNOSIS — E1169 Type 2 diabetes mellitus with other specified complication: Secondary | ICD-10-CM

## 2021-12-14 DIAGNOSIS — M7712 Lateral epicondylitis, left elbow: Secondary | ICD-10-CM

## 2021-12-14 DIAGNOSIS — E782 Mixed hyperlipidemia: Secondary | ICD-10-CM

## 2021-12-14 LAB — POCT GLYCOSYLATED HEMOGLOBIN (HGB A1C): Hemoglobin A1C: 7.6 % — AB (ref 4.0–5.6)

## 2021-12-14 MED ORDER — ATORVASTATIN CALCIUM 40 MG PO TABS: 40.0000 mg | ORAL_TABLET | Freq: Every day | ORAL | 3 refills | Status: DC

## 2021-12-14 MED ORDER — METFORMIN HCL 1000 MG PO TABS: 1000.0000 mg | ORAL_TABLET | Freq: Two times a day (BID) | ORAL | 3 refills | Status: DC

## 2021-12-14 MED ORDER — PIOGLITAZONE HCL 15 MG PO TABS: 15.0000 mg | ORAL_TABLET | Freq: Every day | ORAL | 3 refills | Status: DC

## 2021-12-14 MED ORDER — DICLOFENAC SODIUM 75 MG PO TBEC: 75.0000 mg | DELAYED_RELEASE_TABLET | Freq: Two times a day (BID) | ORAL | 0 refills | Status: DC

## 2021-12-14 NOTE — Progress Notes (Signed)
° °Subjective  °CC:  °Chief Complaint  °Patient presents with  ° Diabetes  ° ° °HPI: Tim Nielsen is a 45 y.o. male who presents to the office today for follow up of diabetes and problems listed above in the chief complaint.  °Diabetes follow up: His diabetic control is reported as Unchanged. Continues on gluctotrol xl 10 and met 1000 bid. Fairly good diet. Little exercise. Feels well.  He denies exertional CP or SOB or symptomatic hypoglycemia. He denies foot sores or paresthesias.  °HLD On statin with good control °C/o 4-month history of bilateral elbow pain.  Started after exercise.  No injury during exercise but had been lifting heavy.  He rested without much resolution.  Now with daily pain.  Not certain what movements exacerbate pain.  Does have aching at night.  No numbness or tingling in the hand.  No neck pain.  No weakness.  No swelling. ° °Wt Readings from Last 3 Encounters:  °12/14/21 180 lb 9.6 oz (81.9 kg)  °05/16/21 175 lb 3.2 oz (79.5 kg)  °02/07/21 171 lb (77.6 kg)  ° ° °BP Readings from Last 3 Encounters:  °12/14/21 124/89  °05/16/21 (!) 146/103  °02/07/21 118/70  ° ° °Assessment  °1. Uncontrolled type 2 diabetes mellitus with hyperglycemia (HCC)   °2. Combined hyperlipidemia associated with type 2 diabetes mellitus (HCC)   °3. Lateral epicondylitis of both elbows   ° °  °Plan  °Diabetes is currently marginally controlled.  A1c remains above goal.  He has a high deductible pharmaceutical drug plan so cost of medications have been prohibitive.  Cannot afford the SGLT2 or GLP-1 class at this time.  Continue Glucotrol XL 10 mg daily and metformin 1000 mg twice daily.  We will add Actos which is plan covers generically.  He will consider changing insurance plans in November so that we can adjust medications at that time.  Recheck 3 months.  Patient to schedule eye exam.  Declines flu shot at this time °Continue statin for hyperlipidemia we will recheck fasting lipids in 3 months for  complete physical °Bilateral epicondylitis: Education, prognosis and treatment plan discussed.  Patient elects to start with rice therapy.  Voltaren twice daily for 2 weeks recommended.  Consideration of tennis elbow straps.  Recheck in 3 to 4 weeks if not improved. ° °Follow up: 3 months for complete physical. °Orders Placed This Encounter  °Procedures  ° POCT HgB A1C  ° °Meds ordered this encounter  °Medications  ° pioglitazone (ACTOS) 15 MG tablet  °  Sig: Take 1 tablet (15 mg total) by mouth daily.  °  Dispense:  90 tablet  °  Refill:  3  ° diclofenac (VOLTAREN) 75 MG EC tablet  °  Sig: Take 1 tablet (75 mg total) by mouth 2 (two) times daily.  °  Dispense:  30 tablet  °  Refill:  0  ° atorvastatin (LIPITOR) 40 MG tablet  °  Sig: Take 1 tablet (40 mg total) by mouth daily.  °  Dispense:  90 tablet  °  Refill:  3  °  Please keep on file for next due refill  ° metFORMIN (GLUCOPHAGE) 1000 MG tablet  °  Sig: Take 1 tablet (1,000 mg total) by mouth 2 (two) times daily with a meal.  °  Dispense:  180 tablet  °  Refill:  3  °  Please keep on file for next due refill  ° °  ° °Immunization History  °Administered Date(s) Administered  °   Influenza,inj,Quad PF,6+ Mos 08/22/2017, 08/11/2018, 10/05/2019  ° PFIZER(Purple Top)SARS-COV-2 Vaccination 10/10/2020, 11/18/2020  ° Pneumococcal Polysaccharide-23 10/05/2019  ° Tdap 08/11/2018  ° ° °Diabetes Related Lab Review: °Lab Results  °Component Value Date  ° HGBA1C 7.6 (A) 12/14/2021  ° HGBA1C 7.6 (A) 05/16/2021  ° HGBA1C 10.5 (A) 02/07/2021  °  °Lab Results  °Component Value Date  ° MICROALBUR 2.0 (H) 02/07/2021  ° °Lab Results  °Component Value Date  ° CREATININE 0.78 05/16/2021  ° BUN 15 05/16/2021  ° NA 136 05/16/2021  ° K 3.8 05/16/2021  ° CL 99 05/16/2021  ° CO2 27 05/16/2021  ° °Lab Results  °Component Value Date  ° CHOL 157 05/16/2021  ° CHOL 215 (H) 02/07/2021  ° CHOL 165 01/14/2020  ° °Lab Results  °Component Value Date  ° HDL 57.60 05/16/2021  ° HDL 47.00 02/07/2021  °  HDL 52.60 01/14/2020  ° °No results found for: LDLCALC °Lab Results  °Component Value Date  ° TRIG 342.0 (H) 05/16/2021  ° TRIG 330.0 (H) 02/07/2021  ° TRIG 210.0 (H) 01/14/2020  ° °Lab Results  °Component Value Date  ° CHOLHDL 3 05/16/2021  ° CHOLHDL 5 02/07/2021  ° CHOLHDL 3 01/14/2020  ° °Lab Results  °Component Value Date  ° LDLDIRECT 64.0 05/16/2021  ° LDLDIRECT 117.0 02/07/2021  ° LDLDIRECT 89.0 01/14/2020  ° °The 10-year ASCVD risk score (Arnett DK, et al., 2019) is: 2.7% °  Values used to calculate the score: °    Age: 45 years °    Sex: Male °    Is Non-Hispanic African American: No °    Diabetic: Yes °    Tobacco smoker: No °    Systolic Blood Pressure: 124 mmHg °    Is BP treated: No °    HDL Cholesterol: 57.6 mg/dL °    Total Cholesterol: 157 mg/dL °I have reviewed the PMH, Fam and Soc history. °Patient Active Problem List  ° Diagnosis Date Noted  ° Uncontrolled type 2 diabetes mellitus with hyperglycemia (HCC) 02/07/2021  ° Gastroesophageal reflux disease 02/07/2021  ° Complex regional pain syndrome type 1 of left lower extremity 02/07/2021  ° Heavy alcohol consumption 02/07/2021  ° Overweight 10/05/2019  ° Combined hyperlipidemia associated with type 2 diabetes mellitus (HCC)   ° ° °Social History: °Patient  reports that he has quit smoking. He has quit using smokeless tobacco. He reports current alcohol use. He reports that he does not use drugs. ° °Review of Systems: °Ophthalmic: negative for eye pain, loss of vision or double vision °Cardiovascular: negative for chest pain °Respiratory: negative for SOB or persistent cough °Gastrointestinal: negative for abdominal pain °Genitourinary: negative for dysuria or gross hematuria °MSK: negative for foot lesions °Neurologic: negative for weakness or gait disturbance ° °Objective  °Vitals: BP 124/89    Pulse 70    Temp 98 °F (36.7 °C) (Temporal)    Ht 5' 6" (1.676 m)    Wt 180 lb 9.6 oz (81.9 kg)    SpO2 98%    BMI 29.15 kg/m²  °General: well appearing, no  acute distress  °Psych:  Alert and oriented, normal mood and affect °HEENT:  Normocephalic, atraumatic, moist mucous membranes, supple neck  °Cardiovascular:  Nl S1 and S2, RRR without murmur, gallop or rub. no edema °Bilateral elbows: Normal appearing, full range of motion, no bony tenderness.  Bilateral epicondyles tenderness, pain with resisted external rotation.  No brachioradialis muscle tenderness.  Normal strength at wrist bilaterally. ° ° ° °Diabetic   education: ongoing education regarding chronic disease management for diabetes was given today. We continue to reinforce the ABC's of diabetic management: A1c (<7 or 8 dependent upon patient), tight blood pressure control, and cholesterol management with goal LDL < 100 minimally. We discuss diet strategies, exercise recommendations, medication options and possible side effects. At each visit, we review recommended immunizations and preventive care recommendations for diabetics and stress that good diabetic control can prevent other problems. See below for this patient's data. ° ° °Commons side effects, risks, benefits, and alternatives for medications and treatment plan prescribed today were discussed, and the patient expressed understanding of the given instructions. Patient is instructed to call or message via MyChart if he/she has any questions or concerns regarding our treatment plan. No barriers to understanding were identified. We discussed Red Flag symptoms and signs in detail. Patient expressed understanding regarding what to do in case of urgent or emergency type symptoms.  °Medication list was reconciled, printed and provided to the patient in AVS. Patient instructions and summary information was reviewed with the patient as documented in the AVS. °This note was prepared with assistance of Dragon voice recognition software. Occasional wrong-word or sound-a-like substitutions may have occurred due to the inherent limitations of voice recognition  software ° °This visit occurred during the SARS-CoV-2 public health emergency.  Safety protocols were in place, including screening questions prior to the visit, additional usage of staff PPE, and extensive cleaning of exam room while observing appropriate contact time as indicated for disinfecting solutions.  ° °

## 2021-12-14 NOTE — Patient Instructions (Signed)
Please return in 3 months for your annual complete physical; please come fasting.   Please set up an appointment for a diabetic eye exam and have the results sent to me.   Ice the elbows for 10 minutes 2-3x/day. Use the anti-inflammatory medications twice daily for 2 weeks. Consider getting tennis elbow straps.   If you have any questions or concerns, please don't hesitate to send me a message via MyChart or call the office at [phone]. Thank you for visiting with [place] today! It's our pleasure caring for you.   Tennis Elbow Tennis elbow (lateral epicondylitis) is inflammation of tendons in your outer forearm, near your elbow. Tendons are tissues that connect muscle to bone. When you have tennis elbow, inflammation affects the tendons that you use to bend your wrist and move your hand up. Inflammation occurs in the lower part of the upper arm bone (humerus), where the tendons connect to the bone (lateral epicondyle). Tennis elbow often affects people who play tennis, but anyone may get the condition from repeatedly extending the wrist or turning the forearm. What are the causes? This condition is usually caused by repeatedly extending the wrist, turning the forearm, and using the hands. It can result from sports or work that requires repetitive forearm movements. In some cases, it may be caused by a sudden injury. What increases the risk? You are more likely to develop tennis elbow if you play tennis or another racket sport. You also have a higher risk if you frequently use your hands for work. Besides people who play tennis, others at greater risk include: People who use computers. Holiday representative workers. People who work in [employer]. Musicians. Cooks. Cashiers. What are the signs or symptoms? Symptoms of this condition include: Pain and tenderness in the forearm and the outer part of the elbow. Pain may be felt only when using the arm, or it may be there all the time. A burning feeling that  starts in the elbow and spreads down the forearm. A weak grip in the hand. How is this diagnosed? This condition is diagnosed based on your symptoms, your medical history, and a physical exam. You may also have X-rays or an MRI to: Confirm the diagnosis. Look for other issues. Check for tears in the ligaments, muscles, or tendons. How is this treated? Resting and icing your arm is often the first treatment. Your health care provider may also recommend: Medicines to reduce pain and inflammation. These may be in the form of a pill, topical gels, or shots of a steroid medicine (cortisone). An elbow strap to reduce stress on the area. Physical therapy. This may include massage or exercises or both. An elbow brace to restrict the movements that cause symptoms. If these treatments do not help relieve your symptoms, your health care provider may recommend surgery to remove damaged muscle and reattach healthy muscle to bone. Follow these instructions at home: If you have a brace or strap: Wear the brace or strap as told by your health care provider. Remove it only as told by your health care provider. Check the skin around the brace or strap every day. Tell your health care provider about any concerns. Loosen the brace if your fingers tingle, become numb, or turn cold and blue. Keep the brace clean. If the brace or strap is not waterproof: Do not let it get wet. Cover it with a watertight covering when you take a bath or a shower. Managing pain, stiffness, and swelling  If directed, put ice  on the injured area. To do this: If you have a removable brace or strap, remove it as told by your health care provider. Put ice in a plastic bag. Place a towel between your skin and the bag. Leave the ice on for 20 minutes, 2-3 times a day. Remove the ice if your skin turns bright red. This is very important. If you cannot feel pain, heat, or cold, you have a greater risk of damage to the area. Move your  fingers often to reduce stiffness and swelling. Activity Rest your elbow and wrist and avoid activities that cause symptoms as told by your health care provider. Do physical therapy exercises as told by your health care provider. If you lift an object, lift it with your palm facing up. This reduces stress on your elbow. Lifestyle If your tennis elbow is caused by sports, check your equipment and make sure that: You use it correctly. It is good match for you. If your tennis elbow is caused by work or computer use, take frequent breaks to stretch your arm. Talk with your employer about ways to manage your condition at work. General instructions Take over-the-counter and prescription medicines only as told by your health care provider. Do not use any products that contain nicotine or tobacco. These products include cigarettes, chewing tobacco, and vaping devices, such as e-cigarettes. If you need help quitting, ask your health care provider. Keep all follow-up visits. This is important. How is this prevented? Before and after activity: Warm up and stretch before being active. Cool down and stretch after being active. Give your body time to rest between periods of activity. During activity: Make sure to use equipment that fits you. If you play tennis, put power in your stroke with your lower body. Avoid using your arm only. Maintain physical fitness, including: Strength. Flexibility. Endurance. Do exercises to strengthen the forearm muscles. Contact a health care provider if: You have pain that gets worse or does not get better with treatment. You have numbness or weakness in your forearm, hand, or fingers. Get help right away if: Your pain is severe. You cannot move your wrist. Summary Tennis elbow (lateral epicondylitis) is inflammation of tendons in your outer forearm, near your elbow. Common symptoms include pain and tenderness in your forearm and the outer part of your  elbow. This condition is usually caused by repeatedly extending your wrist, turning your forearm, and using your hands. The first treatment is often resting and icing your arm to relieve symptoms. Further treatment may include taking medicine, getting physical therapy, wearing a brace or strap, or having surgery. This information is not intended to replace advice given to you by your health care provider. Make sure you discuss any questions you have with your health care provider. Document Revised: 04/25/2020 Document Reviewed: 04/25/2020 Elsevier Patient Education  2022 [employer].

## 2022-01-04 ENCOUNTER — Other Ambulatory Visit: Payer: Self-pay | Admitting: Family Medicine

## 2022-01-04 MED ORDER — DICLOFENAC SODIUM 75 MG PO TBEC: 75.0000 mg | DELAYED_RELEASE_TABLET | Freq: Two times a day (BID) | ORAL | 0 refills | Status: DC

## 2022-03-04 ENCOUNTER — Other Ambulatory Visit: Payer: Self-pay

## 2022-03-04 ENCOUNTER — Telehealth: Payer: Self-pay

## 2022-03-04 ENCOUNTER — Other Ambulatory Visit: Payer: Self-pay | Admitting: Family Medicine

## 2022-03-04 MED ORDER — GLIPIZIDE ER 10 MG PO TB24: 10.0000 mg | ORAL_TABLET | Freq: Every day | ORAL | 3 refills | Status: DC

## 2022-03-04 MED ORDER — DICLOFENAC SODIUM 75 MG PO TBEC: 75.0000 mg | DELAYED_RELEASE_TABLET | Freq: Two times a day (BID) | ORAL | 0 refills | Status: DC

## 2022-03-04 MED ORDER — ATORVASTATIN CALCIUM 20 MG PO TABS: 20.0000 mg | ORAL_TABLET | Freq: Every day | ORAL | 1 refills | Status: DC

## 2022-03-04 NOTE — Telephone Encounter (Signed)
Rx sent 

## 2022-03-04 NOTE — Telephone Encounter (Signed)
..   Encourage patient to contact the pharmacy for refills or they can request refills through [facility]  LAST APPOINTMENT DATE:  12/14/2021  NEXT APPOINTMENT DATE:  04/04/2022  MEDICATION:diclofenac  Is the patient out of medication?   PHARMACY:  [facility] - [address]  Let patient know to contact pharmacy at the end of the day to make sure medication is ready.  Please notify patient to allow 48-72 hours to process  CLINICAL FILLS OUT ALL BELOW:   LAST REFILL:  QTY:  REFILL DATE:    OTHER COMMENTS:    Okay for refill?  Please advise

## 2022-03-25 ENCOUNTER — Other Ambulatory Visit: Payer: Self-pay | Admitting: Family Medicine

## 2022-04-04 ENCOUNTER — Encounter: Payer: Self-pay | Admitting: Family Medicine

## 2022-04-04 ENCOUNTER — Ambulatory Visit (INDEPENDENT_AMBULATORY_CARE_PROVIDER_SITE_OTHER): Payer: 59 | Admitting: Family Medicine

## 2022-04-04 VITALS — BP 130/82 | HR 85 | Temp 98.6°F | Ht 66.0 in | Wt 183.2 lb

## 2022-04-04 DIAGNOSIS — S20461A Insect bite (nonvenomous) of right back wall of thorax, initial encounter: Secondary | ICD-10-CM

## 2022-04-04 DIAGNOSIS — G90522 Complex regional pain syndrome I of left lower limb: Secondary | ICD-10-CM | POA: Diagnosis not present

## 2022-04-04 DIAGNOSIS — W57XXXA Bitten or stung by nonvenomous insect and other nonvenomous arthropods, initial encounter: Secondary | ICD-10-CM | POA: Diagnosis not present

## 2022-04-04 DIAGNOSIS — E1169 Type 2 diabetes mellitus with other specified complication: Secondary | ICD-10-CM | POA: Diagnosis not present

## 2022-04-04 DIAGNOSIS — E1165 Type 2 diabetes mellitus with hyperglycemia: Secondary | ICD-10-CM

## 2022-04-04 DIAGNOSIS — R17 Unspecified jaundice: Secondary | ICD-10-CM

## 2022-04-04 DIAGNOSIS — E782 Mixed hyperlipidemia: Secondary | ICD-10-CM

## 2022-04-04 DIAGNOSIS — Z0000 Encounter for general adult medical examination without abnormal findings: Secondary | ICD-10-CM

## 2022-04-04 DIAGNOSIS — Z0001 Encounter for general adult medical examination with abnormal findings: Secondary | ICD-10-CM | POA: Diagnosis not present

## 2022-04-04 LAB — CBC WITH DIFFERENTIAL/PLATELET
Basophils Absolute: 0 10*3/uL (ref 0.0–0.1)
Basophils Relative: 0.7 % (ref 0.0–3.0)
Eosinophils Absolute: 0.2 10*3/uL (ref 0.0–0.7)
Eosinophils Relative: 2.4 % (ref 0.0–5.0)
HCT: 43.7 % (ref 39.0–52.0)
Hemoglobin: 14.8 g/dL (ref 13.0–17.0)
Lymphocytes Relative: 21.6 % (ref 12.0–46.0)
Lymphs Abs: 1.4 10*3/uL (ref 0.7–4.0)
MCHC: 33.9 g/dL (ref 30.0–36.0)
MCV: 89.3 fl (ref 78.0–100.0)
Monocytes Absolute: 1 10*3/uL (ref 0.1–1.0)
Monocytes Relative: 14.7 % — ABNORMAL HIGH (ref 3.0–12.0)
Neutro Abs: 4 10*3/uL (ref 1.4–7.7)
Neutrophils Relative %: 60.6 % (ref 43.0–77.0)
Platelets: 190 10*3/uL (ref 150.0–400.0)
RBC: 4.89 Mil/uL (ref 4.22–5.81)
RDW: 12.9 % (ref 11.5–15.5)
WBC: 6.6 10*3/uL (ref 4.0–10.5)

## 2022-04-04 LAB — LIPID PANEL
Cholesterol: 227 mg/dL — ABNORMAL HIGH (ref 0–200)
HDL: 58.1 mg/dL (ref >39.00)
NonHDL: 168.97
Total CHOL/HDL Ratio: 4
Triglycerides: 332 mg/dL — ABNORMAL HIGH (ref 0.0–149.0)
VLDL: 66.4 mg/dL — ABNORMAL HIGH (ref 0.0–40.0)

## 2022-04-04 LAB — COMPREHENSIVE METABOLIC PANEL
ALT: 42 U/L (ref 0–53)
AST: 35 U/L (ref 0–37)
Albumin: 4.5 g/dL (ref 3.5–5.2)
Alkaline Phosphatase: 51 U/L (ref 39–117)
BUN: 14 mg/dL (ref 6–23)
CO2: 25 mEq/L (ref 19–32)
Calcium: 9.8 mg/dL (ref 8.4–10.5)
Chloride: 101 mEq/L (ref 96–112)
Creatinine, Ser: 0.85 mg/dL (ref 0.40–1.50)
GFR: 103.08 mL/min (ref >60.00)
Glucose, Bld: 166 mg/dL — ABNORMAL HIGH (ref 70–99)
Potassium: 4.3 mEq/L (ref 3.5–5.1)
Sodium: 136 mEq/L (ref 135–145)
Total Bilirubin: 0.6 mg/dL (ref 0.2–1.2)
Total Protein: 7.5 g/dL (ref 6.0–8.3)

## 2022-04-04 LAB — LDL CHOLESTEROL, DIRECT: Direct LDL: 112 mg/dL

## 2022-04-04 LAB — MICROALBUMIN / CREATININE URINE RATIO
Creatinine,U: 93.9 mg/dL
Microalb Creat Ratio: 2.6 mg/g (ref 0.0–30.0)
Microalb, Ur: 2.4 mg/dL — ABNORMAL HIGH (ref 0.0–1.9)

## 2022-04-04 LAB — HEMOGLOBIN A1C: Hgb A1c MFr Bld: 7.3 % — ABNORMAL HIGH (ref 4.6–6.5)

## 2022-04-04 MED ORDER — ATORVASTATIN CALCIUM 40 MG PO TABS: 40.0000 mg | ORAL_TABLET | Freq: Every day | ORAL | 3 refills | Status: DC

## 2022-04-04 MED ORDER — LISINOPRIL 5 MG PO TABS
5.0000 mg | ORAL_TABLET | Freq: Every day | ORAL | 3 refills | Status: DC
Start: 2022-04-04 — End: 2023-04-09

## 2022-04-04 NOTE — Patient Instructions (Signed)
Please return in 3 months for diabetes follow up   I will release your lab results to you on your MyChart account with further instructions. You may see the results before I do, but when I review them I will send you a message with my report or have my assistant call you if things need to be discussed. Please reply to my message with any questions. Thank you!   Please set up an appointment for a diabetic eye exam and have the results sent to me.   If you have any questions or concerns, please don't hesitate to send me a message via MyChart or call the office at [phone]. Thank you for visiting with us today! It's our pleasure caring for you.  

## 2022-04-04 NOTE — Progress Notes (Signed)
Subjective  Chief Complaint  Patient presents with   Annual Exam    Pt here for Annual exam and is currently fasting. Pt will schedule eye exam    HPI: Tim Nielsen is a 46 y.o. male who presents to [place] [facility] [facility] [place] today for a Male Wellness Visit. He also has the concerns and/or needs as listed above in the chief complaint. These will be addressed in addition to the Health Maintenance Visit.   Wellness Visit: annual visit with health maintenance review and exam   Health maintenance: Colorectal cancer screening is current.  Immunizations are current.  He is due for diabetic eye exam and will schedule.  Overall feeling well.  Body mass index is 29.57 kg/m. Wt Readings from Last 3 Encounters:  04/04/22 183 lb 3.2 oz (83.1 kg)  12/14/21 180 lb 9.6 oz (81.9 kg)  05/16/21 175 lb 3.2 oz (79.5 kg)     Chronic disease management visit and/or acute problem visit: Tick bite 2 weeks ago.  Still has small bump.  Right upper back.  No systemic symptoms.  No fevers or rash. Uncontrolled diabetes: Now on metformin 1000 twice daily, Glucotrol XL 10 daily and Actos 15 mg daily.  Tolerating all medications well.  He is on this medical regimen due to problems with affording medications.  No adverse side effects.  He feels his sugars are fairly well controlled.  Does not check them routinely.  He denies polyuria or polydipsia.  No foot concerns.  He is on ACE inhibitor and statin. Hyperlipidemia: Has been on Lipitor 40 mg nightly.  Prescriptions got confused and he is now taking 20 mg nightly for the last 3 weeks.  Tolerating well.  Fasting for recheck today.  Goal LDL less than 70. Elevated bilirubin over several years.  Likely Gilbert's disease.  Denies right upper quadrant pain.  No biliary colic symptoms.  Does drink alcohol regularly.  LFTs have been normal.  Diet is fair Complex regional pain syndrome of left leg is stable  Patient Active Problem List   Diagnosis  Date Noted   Rosanna Randy syndrome 04/04/2022   Uncontrolled type 2 diabetes mellitus with hyperglycemia ([place]) 02/07/2021   Complex regional pain syndrome type 1 of left lower extremity 02/07/2021   Heavy alcohol consumption 02/07/2021   Overweight 10/05/2019   Combined hyperlipidemia associated with type 2 diabetes mellitus [facility])    Health Maintenance  Topic Date Due   COVID-19 Vaccine (3 - Pfizer series) 01/13/2021   OPHTHALMOLOGY EXAM  07/28/2021   INFLUENZA VACCINE  05/28/2022   HEMOGLOBIN A1C  06/13/2022   COLONOSCOPY (Pts 45-42yr Insurance coverage will need to be confirmed)  10/28/2022   FOOT EXAM  04/05/2023   TETANUS/TDAP  08/11/2028   Hepatitis C Screening  Completed   HIV Screening  Completed   HPV VACCINES  Aged Out   Immunization History  Administered Date(s) Administered   Influenza,inj,Quad PF,6+ Mos 08/22/2017, 08/11/2018, 10/05/2019   PFIZER(Purple Top)SARS-COV-2 Vaccination 10/10/2020, 11/18/2020   Pneumococcal Polysaccharide-23 10/05/2019   Tdap 08/11/2018   We updated and reviewed the patient's past history in detail and it is documented below. Allergies: Patient has No Known Allergies. Past Medical History  has a past medical history of Complex regional pain syndrome of left lower extremity, Diabetes mellitus without complication ([place], History of colonoscopy (2014), and HLD (hyperlipidemia). Past Surgical History Patient  has a past surgical history that includes ORIF ankle fracture (Left). Social History Patient  reports that he has  quit smoking. He has quit using smokeless tobacco. He reports current alcohol use. He reports that he does not use drugs. Family History family history includes Diabetes in his father and mother; Healthy in his daughter and daughter; Hyperlipidemia in his father and mother; Hypertension in his father and mother. Review of Systems: Constitutional: negative for fever or malaise Ophthalmic: negative for photophobia, double vision  or loss of vision Cardiovascular: negative for chest pain, dyspnea on exertion, or new LE swelling Respiratory: negative for SOB or persistent cough Gastrointestinal: negative for abdominal pain, change in bowel habits or melena Genitourinary: negative for dysuria or gross hematuria Musculoskeletal: negative for new gait disturbance or muscular weakness Integumentary: negative for new or persistent rashes Neurological: negative for TIA or stroke symptoms Psychiatric: negative for SI or delusions Allergic/Immunologic: negative for hives  Patient Care Team    Relationship Specialty Notifications Start End  Leamon Arnt, MD PCP - General Family Medicine  10/05/19    Objective  Vitals: BP 130/82   Pulse 85   Temp 98.6 F (37 C)   Ht _0  (1.676 m)   Wt 183 lb 3.2 oz (83.1 kg)   SpO2 98%   BMI 29.57 kg/m  General:  Well developed, well nourished, no acute distress  Psych:  Alert and orientedx3,normal mood and affect HEENT:  Normocephalic, atraumatic, non-icteric sclera, PERRL, oropharynx is clear without mass or exudate, supple neck without adenopathy, mass or thyromegaly Cardiovascular:  Normal S1, S2, RRR without gallop, rub or murmur, nondisplaced PMI, +2 distal pulses in bilateral upper and lower extremities. Respiratory:  Good breath sounds bilaterally, CTAB with normal respiratory effort Gastrointestinal: normal bowel sounds, soft, non-tender, no noted masses. No HSM MSK: no deformities, contusions. Joints are without erythema or swelling. Spine and CVA region are nontender Skin:  Warm, no rashes or suspicious lesions noted, small remnant piece of tick at site of tick bite right upper back.  No surrounding redness or rash. Neurologic:    Mental status is normal.  Gross motor and sensory exams are normal. Stable gait. No tremor  Foreign body removal, simple, skin: procedure note  Area was cleansed with alcohol. Using a 25 ga 1/5" needle, the small nodule was unroofed and the  black remnant of the tick was removed.  Minimal bleeding. Pt tolerated well.   Assessment  1. Annual physical exam   2. Uncontrolled type 2 diabetes mellitus with hyperglycemia ([place])   3. Complex regional pain syndrome type 1 of left lower extremity   4. Combined hyperlipidemia associated with type 2 diabetes mellitus ([place])   5. Gilbert syndrome   6. Elevated bilirubin   7. Tick bite of right back wall of thorax, initial encounter      Plan  Male Wellness Visit: Age appropriate Health Maintenance and Prevention measures were discussed with patient. Included topics are cancer screening recommendations, ways to keep healthy (see AVS) including dietary and exercise recommendations, regular eye and dental care, use of seat belts, and avoidance of moderate alcohol use and tobacco use.  BMI: discussed patient's BMI and encouraged positive lifestyle modifications to help get to or maintain a target BMI. HM needs and immunizations were addressed and ordered. See below for orders. See HM and immunization section for updates. Routine labs and screening tests ordered including cmp, cbc and lipids where appropriate. Discussed recommendations regarding Vit D and calcium supplementation (see AVS)  Chronic disease f/u and/or acute problem visit: (deemed necessary to be done in addition to the wellness visit):  Diabetes: recheck a1c. Adjust meds if not yet controlled. Continue ace and statin. Urine nephropathy screen ordered. Pt to schedule a diabetic eye exam  HLD: recheck lipids on statin. Educated: should be on 54m lipitor nightly. Check lfts Hyperbilirubinemia: likely gilbert; check liver ultrasound. R/u gallstones or obstruction. Check for steatosis. Nl lfts Tick bite w/o sxs. Removed remnant body part. Routine care instructions given.   Follow up: Return in about 3 months (around 07/05/2022) for follow up Diabetes.  Commons side effects, risks, benefits, and alternatives for medications and treatment  plan prescribed today were discussed, and the patient expressed understanding of the given instructions. Patient is instructed to call or message via MyChart if he/she has any questions or concerns regarding our treatment plan. No barriers to understanding were identified. We discussed Red Flag symptoms and signs in detail. Patient expressed understanding regarding what to do in case of urgent or emergency type symptoms.  Medication list was reconciled, printed and provided to the patient in AVS. Patient instructions and summary information was reviewed with the patient as documented in the AVS. This note was prepared with assistance of Dragon voice recognition software. Occasional wrong-word or sound-a-like substitutions may have occurred due to the inherent limitations of voice recognition software  This visit occurred during the SARS-CoV-2 public health emergency.  Safety protocols were in place, including screening questions prior to the visit, additional usage of staff PPE, and extensive cleaning of exam room while observing appropriate contact time as indicated for disinfecting solutions.   Orders Placed This Encounter  Procedures   [place] LIMITED RUQ (LIVER/GB)   CBC with Differential/Platelet   Comp Met (CMET)   Lipid Profile   Hemoglobin A1c   Microalbumin / creatinine urine ratio   Meds ordered this encounter  Medications   lisinopril (ZESTRIL) 5 MG tablet    Sig: Take 1 tablet (5 mg total) by mouth daily.    Dispense:  90 tablet    Refill:  3   atorvastatin (LIPITOR) 40 MG tablet    Sig: Take 1 tablet (40 mg total) by mouth daily.    Dispense:  90 tablet    Refill:  3

## 2022-04-05 ENCOUNTER — Ambulatory Visit
Admission: RE | Admit: 2022-04-05 | Discharge: 2022-04-05 | Disposition: A | Discharge status: Home / Self Care | Payer: 59 | Source: Ambulatory Visit | Attending: Family Medicine | Admitting: Family Medicine

## 2022-04-05 DIAGNOSIS — R17 Unspecified jaundice: Secondary | ICD-10-CM

## 2022-04-05 MED ORDER — PIOGLITAZONE HCL 30 MG PO TABS: 30.0000 mg | ORAL_TABLET | Freq: Every day | ORAL | 3 refills | Status: DC

## 2022-04-05 NOTE — Addendum Note (Signed)
Addended by: Asencion Partridge on: 04/05/2022 12:31 PM   Modules accepted: Orders

## 2022-04-07 ENCOUNTER — Encounter: Payer: Self-pay | Admitting: Family Medicine

## 2022-04-07 DIAGNOSIS — K760 Fatty (change of) liver, not elsewhere classified: Secondary | ICD-10-CM | POA: Insufficient documentation

## 2022-06-16 ENCOUNTER — Other Ambulatory Visit: Payer: Self-pay | Admitting: Family Medicine

## 2022-07-22 ENCOUNTER — Encounter: Payer: Self-pay | Admitting: *Deleted

## 2022-07-30 ENCOUNTER — Ambulatory Visit (INDEPENDENT_AMBULATORY_CARE_PROVIDER_SITE_OTHER): Payer: 59 | Admitting: Family Medicine

## 2022-07-30 ENCOUNTER — Encounter: Payer: Self-pay | Admitting: Family Medicine

## 2022-07-30 VITALS — BP 134/80 | HR 74 | Temp 98.3°F | Ht 66.0 in | Wt 187.2 lb

## 2022-07-30 DIAGNOSIS — G90522 Complex regional pain syndrome I of left lower limb: Secondary | ICD-10-CM

## 2022-07-30 DIAGNOSIS — Z6830 Body mass index (BMI) 30.0-30.9, adult: Secondary | ICD-10-CM

## 2022-07-30 DIAGNOSIS — G5711 Meralgia paresthetica, right lower limb: Secondary | ICD-10-CM

## 2022-07-30 DIAGNOSIS — Z794 Long term (current) use of insulin: Secondary | ICD-10-CM

## 2022-07-30 DIAGNOSIS — K760 Fatty (change of) liver, not elsewhere classified: Secondary | ICD-10-CM

## 2022-07-30 DIAGNOSIS — E1169 Type 2 diabetes mellitus with other specified complication: Secondary | ICD-10-CM

## 2022-07-30 DIAGNOSIS — Z23 Encounter for immunization: Secondary | ICD-10-CM | POA: Diagnosis not present

## 2022-07-30 DIAGNOSIS — E1142 Type 2 diabetes mellitus with diabetic polyneuropathy: Secondary | ICD-10-CM

## 2022-07-30 DIAGNOSIS — J309 Allergic rhinitis, unspecified: Secondary | ICD-10-CM

## 2022-07-30 DIAGNOSIS — E6609 Other obesity due to excess calories: Secondary | ICD-10-CM | POA: Diagnosis not present

## 2022-07-30 DIAGNOSIS — E782 Mixed hyperlipidemia: Secondary | ICD-10-CM | POA: Diagnosis not present

## 2022-07-30 DIAGNOSIS — M79671 Pain in right foot: Secondary | ICD-10-CM

## 2022-07-30 DIAGNOSIS — E119 Type 2 diabetes mellitus without complications: Secondary | ICD-10-CM | POA: Diagnosis not present

## 2022-07-30 DIAGNOSIS — M79672 Pain in left foot: Secondary | ICD-10-CM

## 2022-07-30 LAB — COMPREHENSIVE METABOLIC PANEL
ALT: 23 U/L (ref 0–53)
AST: 23 U/L (ref 0–37)
Albumin: 4.5 g/dL (ref 3.5–5.2)
Alkaline Phosphatase: 47 U/L (ref 39–117)
BUN: 14 mg/dL (ref 6–23)
CO2: 26 mEq/L (ref 19–32)
Calcium: 9.6 mg/dL (ref 8.4–10.5)
Chloride: 102 mEq/L (ref 96–112)
Creatinine, Ser: 0.93 mg/dL (ref 0.40–1.50)
GFR: 97.18 mL/min (ref >60.00)
Glucose, Bld: 136 mg/dL — ABNORMAL HIGH (ref 70–99)
Potassium: 4.4 mEq/L (ref 3.5–5.1)
Sodium: 137 mEq/L (ref 135–145)
Total Bilirubin: 1.1 mg/dL (ref 0.2–1.2)
Total Protein: 7.3 g/dL (ref 6.0–8.3)

## 2022-07-30 LAB — POCT GLYCOSYLATED HEMOGLOBIN (HGB A1C): Hemoglobin A1C: 6.7 % — AB (ref 4.0–5.6)

## 2022-07-30 LAB — LIPID PANEL
Cholesterol: 175 mg/dL (ref 0–200)
HDL: 59.5 mg/dL (ref >39.00)
LDL Cholesterol: 80 mg/dL (ref 0–99)
NonHDL: 115.6
Total CHOL/HDL Ratio: 3
Triglycerides: 180 mg/dL — ABNORMAL HIGH (ref 0.0–149.0)
VLDL: 36 mg/dL (ref 0.0–40.0)

## 2022-07-30 LAB — TSH: TSH: 2.92 u[IU]/mL (ref 0.35–5.50)

## 2022-07-30 MED ORDER — TRAMADOL HCL 50 MG PO TABS: 50.0000 mg | ORAL_TABLET | Freq: Every day | ORAL | 2 refills | Status: DC | PRN

## 2022-07-30 MED ORDER — DAPAGLIFLOZIN PROPANEDIOL 10 MG PO TABS: 10.0000 mg | ORAL_TABLET | Freq: Every day | ORAL | 5 refills | Status: DC

## 2022-07-30 MED ORDER — FLUTICASONE PROPIONATE 50 MCG/ACT NA SUSP: 1.0000 | Freq: Every day | NASAL | 6 refills | Status: AC

## 2022-07-30 MED ORDER — GABAPENTIN 300 MG PO CAPS: 300.0000 mg | ORAL_CAPSULE | Freq: Every day | ORAL | 0 refills | Status: DC

## 2022-07-30 NOTE — Progress Notes (Signed)
Subjective  CC:  Chief Complaint  Patient presents with   Diabetes    Pt here for DM F/U    HPI: Tim Nielsen is a 46 y.o. male who presents to the office today for follow up of diabetes and problems listed above in the chief complaint.  Diabetes follow up: Patient reports he is ready to get his diabetes under better control.  He is currently on Actos 30, metformin 1000 twice daily and Glucotrol XL 10.  Compliance with medication is mostly good although he will miss his metformin nightly dose and intermittently misses Actos.  Fasting sugars are running 120-130.  Diet is fair.  He admits to occasional paresthesias on his feet. He is due for an eye exam. Diet is fair. He would like to restart the farxiga: he has new insurance now and a lower deductible.  His diabetic control is reported as Improved.  He denies exertional CP or SOB or symptomatic hypoglycemia. He denies foot sores. Reports a 2 year history (first mention today) of numbness in anterior right thigh w/o leg weakness, b/b dysfunction or right sided back pain. Persists, and now with pins and needles pain keeping him awake at night. No rash or injury. Weight is up.  Has chronic left ankle and foot pain due to complex regional pain syndrome.  Has become used to that.  However reports chronic and worsening bilateral foot pain.  This has been ongoing for years.  He reports he has seen podiatrist and has had custom orthotics made.  Advil and Tylenol are not helpful.  He reports he has a hard time standing for prolonged periods of time due to aching in his feet.  Is not localized pain.  There is no swelling or redness.  He is flat-footed.  No injury.  No calf pain. Complains of sinus congestion, postnasal drainage and sneezing.  Ongoing for several weeks.  No fevers chills or myalgias.  Does not take anything for allergies. Hyperlipidemia, mixed: Started on Crestor and fish oil.  He reports good compliance with Crestor but only  intermittently takes the fish oil.  He is not fasting for recheck today.  He does have a history of hepatic steatosis, alcohol use and hepatomegaly.  No right upper quadrant pain.  Wt Readings from Last 3 Encounters:  07/30/22 187 lb 3.2 oz (84.9 kg)  04/04/22 183 lb 3.2 oz (83.1 kg)  12/14/21 180 lb 9.6 oz (81.9 kg)    BP Readings from Last 3 Encounters:  07/30/22 134/80  04/04/22 130/82  12/14/21 124/89    Assessment  1. Controlled type 2 diabetes mellitus with diabetic polyneuropathy, with long-term current use of insulin (HCC)   2. Combined hyperlipidemia associated with type 2 diabetes mellitus ([facility])   3. Gilbert syndrome   4. Class 1 obesity due to excess calories with serious comorbidity and body mass index (BMI) of 30.0 to 30.9 in adult   5. Complex regional pain syndrome type 1 of left lower extremity   6. Hepatic steatosis   7. Chronic allergic rhinitis   8. Bilateral foot pain   9. Meralgia paresthetica of right side   10. Need for immunization against influenza      Plan  Diabetes is currently well controlled.  However, would like to have him stop Actos and start Farxiga 10 mg daily.  Continue metformin 1000 twice daily and Glucotrol XL 10.  Recommend better diet.  He will return for eye exam.  Recheck 3 months.  Sounds  like he may have mild peripheral neuropathy as well. Check nonfasting lipids today.  On Crestor.  We will need to monitor triglycerides when he is fasting.  Recommend better compliance with medications.  Goal LDL less than 70 Monitoring liver function with history of hepatic steatosis and elevated enzymes with alcohol use Educated on chronic allergic rhinitis more exacerbation by seasonal rhinitis.  Start Forensic scientist.  Flonase.  No symptoms of infection present Bilateral foot pain: Chronic.  Flat-footed.  Tramadol for intermittent use only.  If cannot get controlled, will recommend sports medicine orthopedic evaluation. Meralgia paresthetica:  Clinically.  Start gabapentin to see if will help with paresthesia pain.  If cannot controlled, will check EMG nerve conduction studies to ensure not coming from central neuropathy.  Weight loss recommended Flu shot updated today.  I spent a total of 52 minutes for this patient encounter. Time spent included preparation, face-to-face counseling with the patient and coordination of care, review of chart and records, and documentation of the encounter.   Follow up: 3 months to recheck. Orders Placed This Encounter  Procedures   Flu Vaccine QUAD 4mo+IM (Fluarix, Fluzone & Alfiuria Quad PF)   Comprehensive metabolic panel   Lipid panel   TSH   POCT HgB A1C   Meds ordered this encounter  Medications   dapagliflozin propanediol (FARXIGA) 10 MG TABS tablet    Sig: Take 1 tablet (10 mg total) by mouth daily.    Dispense:  30 tablet    Refill:  5   gabapentin (NEURONTIN) 300 MG capsule    Sig: Take 1 capsule (300 mg total) by mouth at bedtime.    Dispense:  90 capsule    Refill:  0   traMADol (ULTRAM) 50 MG tablet    Sig: Take 1 tablet (50 mg total) by mouth daily as needed for moderate pain.    Dispense:  30 tablet    Refill:  2   fluticasone (FLONASE) 50 MCG/ACT nasal spray    Sig: Place 1 spray into both nostrils daily.    Dispense:  16 g    Refill:  6      Immunization History  Administered Date(s) Administered   Influenza,inj,Quad PF,6+ Mos 08/22/2017, 08/11/2018, 10/05/2019, 07/30/2022   PFIZER(Purple Top)SARS-COV-2 Vaccination 10/10/2020, 11/18/2020   Pneumococcal Polysaccharide-23 10/05/2019   Tdap 08/11/2018    Diabetes Related Lab Review: Lab Results  Component Value Date   HGBA1C 6.7 (A) 07/30/2022   HGBA1C 7.3 (H) 04/04/2022   HGBA1C 7.6 (A) 12/14/2021    Lab Results  Component Value Date   MICROALBUR 2.4 (H) 04/04/2022   Lab Results  Component Value Date   CREATININE 0.85 04/04/2022   BUN 14 04/04/2022   NA 136 04/04/2022   K 4.3 04/04/2022   CL 101  04/04/2022   CO2 25 04/04/2022   Lab Results  Component Value Date   CHOL 227 (H) 04/04/2022   CHOL 157 05/16/2021   CHOL 215 (H) 02/07/2021   Lab Results  Component Value Date   HDL 58.10 04/04/2022   HDL 57.60 05/16/2021   HDL 47.00 02/07/2021   No results found for: "LDLCALC" Lab Results  Component Value Date   TRIG 332.0 (H) 04/04/2022   TRIG 342.0 (H) 05/16/2021   TRIG 330.0 (H) 02/07/2021   Lab Results  Component Value Date   CHOLHDL 4 04/04/2022   CHOLHDL 3 05/16/2021   CHOLHDL 5 02/07/2021   Lab Results  Component Value Date   LDLDIRECT 112.0 04/04/2022  LDLDIRECT 64.0 05/16/2021   LDLDIRECT 117.0 02/07/2021   The 10-year ASCVD risk score (Arnett DK, et al., 2019) is: 5.9%   Values used to calculate the score:     Age: 24 years     Sex: Male     Is Non-Hispanic African American: No     Diabetic: Yes     Tobacco smoker: No     Systolic Blood Pressure: 462 mmHg     Is BP treated: No     HDL Cholesterol: 58.1 mg/dL     Total Cholesterol: 227 mg/dL I have reviewed the PMH, Fam and Soc history. Patient Active Problem List   Diagnosis Date Noted   Chronic allergic rhinitis 07/30/2022   Hepatic steatosis 04/07/2022   Rosanna Randy syndrome 04/04/2022   Complex regional pain syndrome type 1 of left lower extremity 02/07/2021   Heavy alcohol consumption 02/07/2021   Combined hyperlipidemia associated with type 2 diabetes mellitus ([place])    Controlled type 2 diabetes mellitus without complication, without long-term current use of insulin ([place])     a1c 7.0 2019     Social History: Patient  reports that he has quit smoking. He has quit using smokeless tobacco. He reports current alcohol use. He reports that he does not use drugs.  Review of Systems: Ophthalmic: negative for eye pain, loss of vision or double vision Cardiovascular: negative for chest pain Respiratory: negative for SOB or persistent cough Gastrointestinal: negative for abdominal  pain Genitourinary: negative for dysuria or gross hematuria MSK: negative for foot lesions Neurologic: negative for weakness or gait disturbance  Objective  Vitals: BP 134/80   Pulse 74   Temp 98.3 F (36.8 C)   Ht 5\' 6"  (1.676 m)   Wt 187 lb 3.2 oz (84.9 kg)   SpO2 99%   BMI 30.21 kg/m  General: well appearing, no acute distress  Psych:  Alert and oriented, normal mood and affect Cardiovascular:  Nl S1 and S2, RRR without murmur, gallop or rub. no edema Respiratory:  Good breath sounds bilaterally, CTAB with normal effort, no rales    Diabetic education: ongoing education regarding chronic disease management for diabetes was given today. We continue to reinforce the ABC's of diabetic management: A1c (<7 or 8 dependent upon patient), tight blood pressure control, and cholesterol management with goal LDL < 100 minimally. We discuss diet strategies, exercise recommendations, medication options and possible side effects. At each visit, we review recommended immunizations and preventive care recommendations for diabetics and stress that good diabetic control can prevent other problems. See below for this patient's data.   Commons side effects, risks, benefits, and alternatives for medications and treatment plan prescribed today were discussed, and the patient expressed understanding of the given instructions. Patient is instructed to call or message via MyChart if he/she has any questions or concerns regarding our treatment plan. No barriers to understanding were identified. We discussed Red Flag symptoms and signs in detail. Patient expressed understanding regarding what to do in case of urgent or emergency type symptoms.  Medication list was reconciled, printed and provided to the patient in AVS. Patient instructions and summary information was reviewed with the patient as documented in the AVS. This note was prepared with assistance of Dragon voice recognition software. Occasional  wrong-word or sound-a-like substitutions may have occurred due to the inherent limitations of voice recognition software

## 2022-07-30 NOTE — Patient Instructions (Addendum)
Please return in 3 months for diabetes follow up. Sooner if needed for other problems.  Schedule visit for your eye exam with [place] on October 19th.   I will release your lab results to you on your MyChart account with further instructions. You may see the results before I do, but when I review them I will send you a message with my report or have my assistant call you if things need to be discussed. Please reply to my message with any questions. Thank you!   We covered a lot today; start the new medications and follow up if not improving. Stop the acots and restart the farxiga.  We can consider checking nerve studies of the right leg to ensure it is not another cause which would be helped with other treatments.   Today you had you flu shot.  If you have any questions or concerns, please don't hesitate to send me a message via MyChart or call the office at [phone]. Thank you for visiting with [place] today! It's our pleasure caring for you.

## 2022-08-01 ENCOUNTER — Encounter: Payer: Self-pay | Admitting: Family Medicine

## 2022-08-06 ENCOUNTER — Telehealth: Payer: Self-pay

## 2022-08-06 NOTE — Telephone Encounter (Signed)
Tramadol has been approved

## 2022-08-09 ENCOUNTER — Telehealth: Payer: Self-pay | Admitting: Family Medicine

## 2022-08-09 ENCOUNTER — Other Ambulatory Visit: Payer: Self-pay

## 2022-08-09 MED ORDER — ACCU-CHEK SOFTCLIX LANCETS MISC: 5 refills | Status: AC

## 2022-08-09 NOTE — Telephone Encounter (Signed)
Sent!

## 2022-08-09 NOTE — Telephone Encounter (Signed)
..   Encourage patient to contact the pharmacy for refills or they can request refills through [place]:  Please schedule appointment if longer than 1 year  NEXT APPOINTMENT DATE:  MEDICATION:  Is the patient out of medication? Accu-Chek Softclix Lancets lancets  And test strips to be sent to pharamcy   PHARMACY: [facility] on [address] [place] [address]   Let patient know to contact pharmacy at the end of the day to make sure medication is ready.  Please notify patient to allow 48-72 hours to process

## 2022-09-20 ENCOUNTER — Other Ambulatory Visit: Payer: Self-pay | Admitting: Family Medicine

## 2022-09-23 ENCOUNTER — Other Ambulatory Visit: Payer: Self-pay

## 2022-09-23 MED ORDER — ACCU-CHEK GUIDE VI STRP: ORAL_STRIP | 1 refills | Status: AC

## 2022-09-23 MED ORDER — METFORMIN HCL 1000 MG PO TABS: ORAL_TABLET | ORAL | 3 refills | Status: AC

## 2022-11-06 ENCOUNTER — Encounter: Payer: Self-pay | Admitting: Family Medicine

## 2022-11-06 ENCOUNTER — Ambulatory Visit (INDEPENDENT_AMBULATORY_CARE_PROVIDER_SITE_OTHER): Payer: 59 | Admitting: Family Medicine

## 2022-11-06 VITALS — BP 108/74 | HR 71 | Temp 97.8°F | Ht 66.0 in | Wt 179.2 lb

## 2022-11-06 DIAGNOSIS — G5711 Meralgia paresthetica, right lower limb: Secondary | ICD-10-CM | POA: Insufficient documentation

## 2022-11-06 DIAGNOSIS — K760 Fatty (change of) liver, not elsewhere classified: Secondary | ICD-10-CM

## 2022-11-06 DIAGNOSIS — Z794 Long term (current) use of insulin: Secondary | ICD-10-CM | POA: Diagnosis not present

## 2022-11-06 DIAGNOSIS — E782 Mixed hyperlipidemia: Secondary | ICD-10-CM

## 2022-11-06 DIAGNOSIS — E1142 Type 2 diabetes mellitus with diabetic polyneuropathy: Secondary | ICD-10-CM | POA: Diagnosis not present

## 2022-11-06 DIAGNOSIS — G90522 Complex regional pain syndrome I of left lower limb: Secondary | ICD-10-CM

## 2022-11-06 DIAGNOSIS — E1169 Type 2 diabetes mellitus with other specified complication: Secondary | ICD-10-CM

## 2022-11-06 LAB — COMPREHENSIVE METABOLIC PANEL
ALT: 23 U/L (ref 0–53)
AST: 27 U/L (ref 0–37)
Albumin: 4.9 g/dL (ref 3.5–5.2)
Alkaline Phosphatase: 63 U/L (ref 39–117)
BUN: 16 mg/dL (ref 6–23)
CO2: 28 mEq/L (ref 19–32)
Calcium: 10 mg/dL (ref 8.4–10.5)
Chloride: 103 mEq/L (ref 96–112)
Creatinine, Ser: 0.97 mg/dL (ref 0.40–1.50)
GFR: 92.22 mL/min (ref >60.00)
Glucose, Bld: 101 mg/dL — ABNORMAL HIGH (ref 70–99)
Potassium: 5 mEq/L (ref 3.5–5.1)
Sodium: 141 mEq/L (ref 135–145)
Total Bilirubin: 1.5 mg/dL — ABNORMAL HIGH (ref 0.2–1.2)
Total Protein: 7.7 g/dL (ref 6.0–8.3)

## 2022-11-06 LAB — LIPID PANEL
Cholesterol: 172 mg/dL (ref 0–200)
HDL: 56.1 mg/dL (ref >39.00)
NonHDL: 116.04
Total CHOL/HDL Ratio: 3
Triglycerides: 204 mg/dL — ABNORMAL HIGH (ref 0.0–149.0)
VLDL: 40.8 mg/dL — ABNORMAL HIGH (ref 0.0–40.0)

## 2022-11-06 LAB — POCT GLYCOSYLATED HEMOGLOBIN (HGB A1C): Hemoglobin A1C: 6.5 % — AB (ref 4.0–5.6)

## 2022-11-06 LAB — MICROALBUMIN / CREATININE URINE RATIO
Creatinine,U: 111.8 mg/dL
Microalb Creat Ratio: 0.6 mg/g (ref 0.0–30.0)
Microalb, Ur: <0.7 mg/dL (ref 0.0–1.9)

## 2022-11-06 LAB — LDL CHOLESTEROL, DIRECT: Direct LDL: 91 mg/dL

## 2022-11-06 MED ORDER — GABAPENTIN 300 MG PO CAPS: 300.0000 mg | ORAL_CAPSULE | Freq: Two times a day (BID) | ORAL | 0 refills | Status: DC

## 2022-11-06 NOTE — Progress Notes (Signed)
Subjective  CC:  Chief Complaint  Patient presents with   Diabetes   Hyperlipidemia    HPI: Tim Nielsen is a 47 y.o. male who presents to the office today for follow up of diabetes and problems listed above in the chief complaint.  Diabetes follow up: His diabetic control is reported as Improved. Tolerating farxiga well. On met and glucotrol xl. Weight is down. Feels fine.  He denies exertional CP or SOB or symptomatic hypoglycemia. He denies foot sores but + paresthesias without sores or pain. On gabapentin 300 qhs w/o change.  Meralgia persthetica: no change.  HLD: ldl above goal last checked but now improved compliance. Non fasting for recheck CRPS; no change  Wt Readings from Last 3 Encounters:  11/06/22 179 lb 3.2 oz (81.3 kg)  07/30/22 187 lb 3.2 oz (84.9 kg)  04/04/22 183 lb 3.2 oz (83.1 kg)    BP Readings from Last 3 Encounters:  11/06/22 108/74  07/30/22 134/80  04/04/22 130/82    Assessment  1. Controlled type 2 diabetes mellitus with diabetic polyneuropathy, with long-term current use of insulin ([place])   2. Combined hyperlipidemia associated with type 2 diabetes mellitus ([place])   3. Hepatic steatosis   4. Complex regional pain syndrome type 1 of left lower extremity   5. Meralgia paresthetica of right side      Plan  Diabetes is currently very well controlled. Continue same. Check urine nephropathy now on ace and farxiga. Foot care discussed.  HLD recheck on crestor. Monitoring lfts w/ h/o hepatic steatosis Check emg/ncv due to sxs of meralgia paresthetica. Education given. Ensure no lumbar radiculopathy.  Peripheral neuropathy: increase dose of gabapentin 300 bid and monitor.   Follow up: 6 mo for cpe. Orders Placed This Encounter  Procedures   POCT HgB A1C   No orders of the defined types were placed in this encounter.     Immunization History  Administered Date(s) Administered   Influenza,inj,Quad PF,6+ Mos 08/22/2017, 08/11/2018,  10/05/2019, 07/30/2022   PFIZER(Purple Top)SARS-COV-2 Vaccination 10/10/2020, 11/18/2020   Pneumococcal Polysaccharide-23 10/05/2019   Tdap 08/11/2018    Diabetes Related Lab Review: Lab Results  Component Value Date   HGBA1C 6.5 (A) 11/06/2022   HGBA1C 6.7 (A) 07/30/2022   HGBA1C 7.3 (H) 04/04/2022    Lab Results  Component Value Date   MICROALBUR 2.4 (H) 04/04/2022   Lab Results  Component Value Date   CREATININE 0.93 07/30/2022   BUN 14 07/30/2022   NA 137 07/30/2022   K 4.4 07/30/2022   CL 102 07/30/2022   CO2 26 07/30/2022   Lab Results  Component Value Date   CHOL 175 07/30/2022   CHOL 227 (H) 04/04/2022   CHOL 157 05/16/2021   Lab Results  Component Value Date   HDL 59.50 07/30/2022   HDL 58.10 04/04/2022   HDL 57.60 05/16/2021   Lab Results  Component Value Date   LDLCALC 80 07/30/2022   Lab Results  Component Value Date   TRIG 180.0 (H) 07/30/2022   TRIG 332.0 (H) 04/04/2022   TRIG 342.0 (H) 05/16/2021   Lab Results  Component Value Date   CHOLHDL 3 07/30/2022   CHOLHDL 4 04/04/2022   CHOLHDL 3 05/16/2021   Lab Results  Component Value Date   LDLDIRECT 112.0 04/04/2022   LDLDIRECT 64.0 05/16/2021   LDLDIRECT 117.0 02/07/2021   The 10-year ASCVD risk score (Arnett DK, et al., 2019) is: 2.7%   Values used to calculate the score:  Age: 75 years     Sex: Male     Is Non-Hispanic African American: No     Diabetic: Yes     Tobacco smoker: No     Systolic Blood Pressure: 734 mmHg     Is BP treated: No     HDL Cholesterol: 59.5 mg/dL     Total Cholesterol: 175 mg/dL I have reviewed the PMH, Fam and Soc history. 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 He has quit using smokeless tobacco. He reports current alcohol use. He reports that he does not use drugs.  Review of Systems: Ophthalmic: negative for eye pain, loss of vision or double vision Cardiovascular: negative for chest pain Respiratory: negative for SOB or persistent cough Gastrointestinal: negative for abdominal pain Genitourinary: negative for dysuria or gross hematuria MSK: negative for foot lesions Neurologic: negative for weakness or gait disturbance  Objective  Vitals: BP 108/74   Pulse 71   Temp 97.8 F (36.6 C)   Ht 5\' 6"  (1.676 m)   Wt 179 lb 3.2 oz (81.3 kg)   SpO2 96%   BMI 28.92 kg/m  General: well appearing, no acute distress  Psych:  Alert and oriented, normal mood and affect Foot exam: no erythema, pallor, or cyanosis visible , +2 distal pulses bilaterally    Diabetic education: ongoing education regarding chronic disease management for diabetes was given today. We continue to reinforce the ABC's of diabetic management: A1c (<7 or 8 dependent upon patient), tight blood pressure control, and cholesterol management with goal LDL < 100 minimally. We discuss diet strategies, exercise recommendations, medication options and possible side effects. At each visit, we review recommended immunizations and preventive care recommendations for diabetics and stress that good diabetic control can prevent other problems. See below for this patient's data.   Commons side effects, risks, benefits, and alternatives for medications and treatment plan prescribed today were discussed, and the patient expressed understanding of the given instructions. Patient is instructed to call or message via MyChart if he/she has any questions  or concerns regarding our treatment plan. No barriers to understanding were identified. We discussed Red Flag symptoms and signs in detail. Patient expressed understanding regarding what to do in case of urgent or emergency type symptoms.  Medication list was reconciled, printed and provided to the patient in AVS. Patient instructions and summary information was reviewed with the patient as documented in the AVS. This note was prepared with assistance of Dragon voice recognition software. Occasional wrong-word or sound-a-like substitutions may have occurred due to the inherent limitations of voice recognition software

## 2022-11-06 NOTE — Patient Instructions (Signed)
Please return in 6 months for your annual complete physical; please come fasting.   Try increasing the gabapentin to twice a day to see if you get any improvement in the numbness in your feet and leg.  I have ordered a nerve conduction test; we will call you and you can inquire about cost.   I will let you know the results of your cholesterol test today. Your diabetes looks good.   If you have any questions or concerns, please don't hesitate to send me a message via MyChart or call the office at [phone]. Thank you for visiting with [place] today! It's our pleasure caring for you.

## 2022-11-08 ENCOUNTER — Other Ambulatory Visit: Payer: Self-pay

## 2022-11-08 MED ORDER — ATORVASTATIN CALCIUM 80 MG PO TABS: 80.0000 mg | ORAL_TABLET | Freq: Every evening | ORAL | 1 refills | Status: AC

## 2023-01-07 ENCOUNTER — Other Ambulatory Visit: Payer: Self-pay | Admitting: Family Medicine

## 2023-01-20 ENCOUNTER — Other Ambulatory Visit: Payer: Self-pay | Admitting: Family Medicine

## 2023-03-22 ENCOUNTER — Other Ambulatory Visit: Payer: Self-pay | Admitting: Family Medicine

## 2023-03-25 NOTE — Telephone Encounter (Signed)
LOV: 11/06/2022  Last Refill date: 07/30/2022  Qty: 30  Refills: 2

## 2023-03-25 NOTE — Telephone Encounter (Signed)
Need to clarify why he is taking the tramadol: paresthetica meralgia or chronic peripheral regional pain syndrome.

## 2023-04-05 ENCOUNTER — Other Ambulatory Visit (HOSPITAL_COMMUNITY): Payer: Self-pay

## 2023-04-05 ENCOUNTER — Telehealth: Payer: Self-pay

## 2023-04-05 NOTE — Telephone Encounter (Signed)
Patient Advocate Encounter  Prior authorization for traMADol HCl 50MG  tablets submitted and APPROVED through [employer].  Test billing returns $2.45 copay for 30 day supply.  Key [id number] Effective: 04-05-2023 - 05-05-2023

## 2023-04-07 NOTE — Telephone Encounter (Signed)
Message sent to pt advise them that Tramadol has been approved and to contact pharmacy to fill

## 2023-04-09 ENCOUNTER — Other Ambulatory Visit: Payer: Self-pay | Admitting: Family Medicine

## 2023-05-12 ENCOUNTER — Ambulatory Visit: Payer: 59 | Admitting: Family Medicine

## 2023-05-23 IMAGING — US US ABDOMEN LIMITED
1 series · 14 of 25 positions shown · non-contrast
Comparison: None Available.

CLINICAL DATA: Elevated bilirubin

EXAM:
ULTRASOUND ABDOMEN LIMITED RIGHT UPPER QUADRANT

[Series 1: us abdomen limited · 0.26mm/px · 14 of 40 slices shown]
[im 1/40]
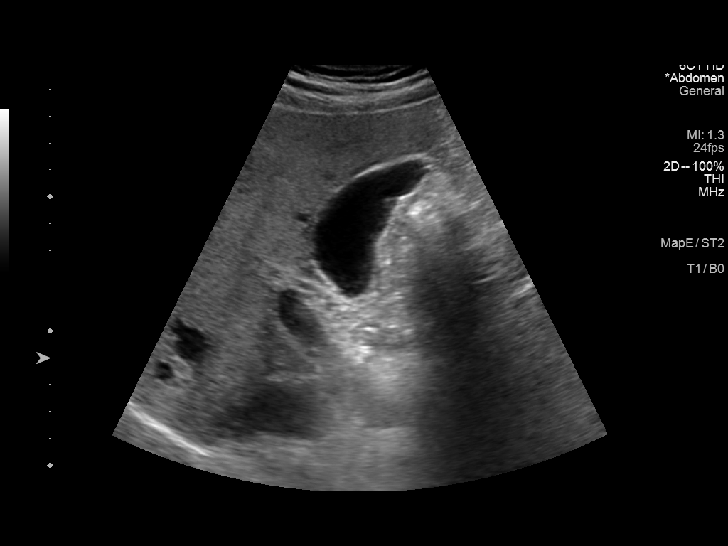
[im 4/40]
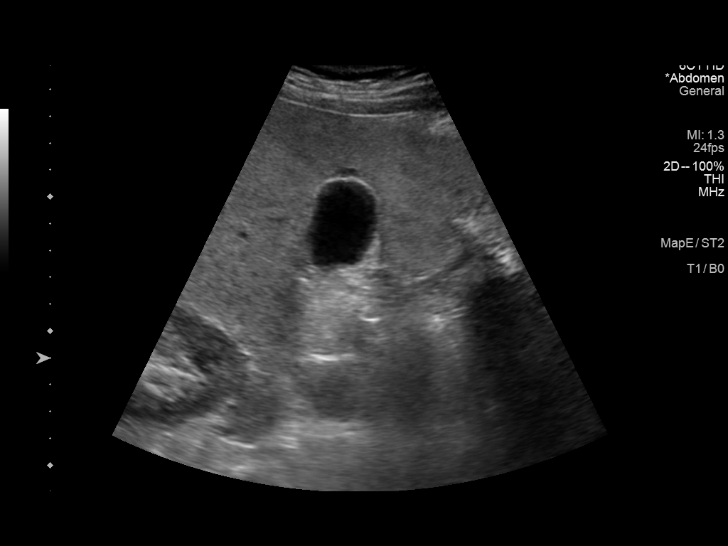
[im 7/40]
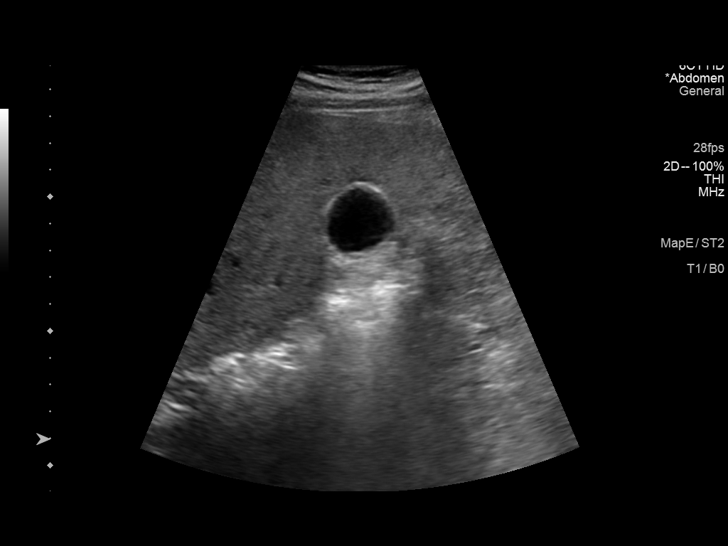
[im 10/40]
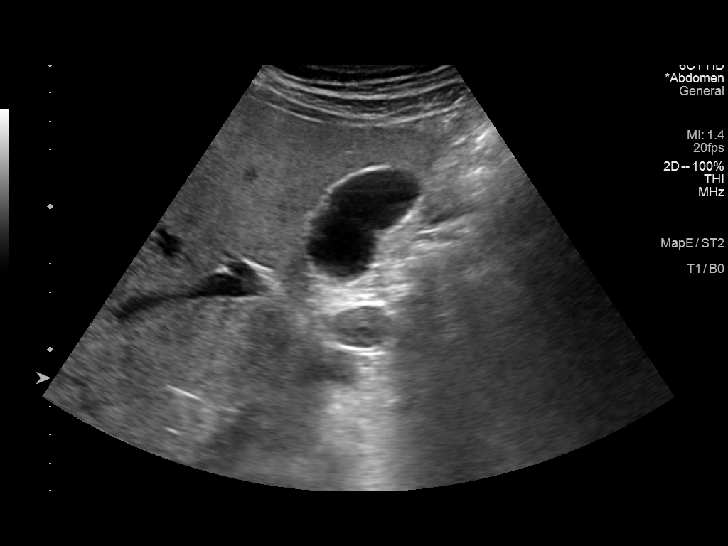
[im 14/40]
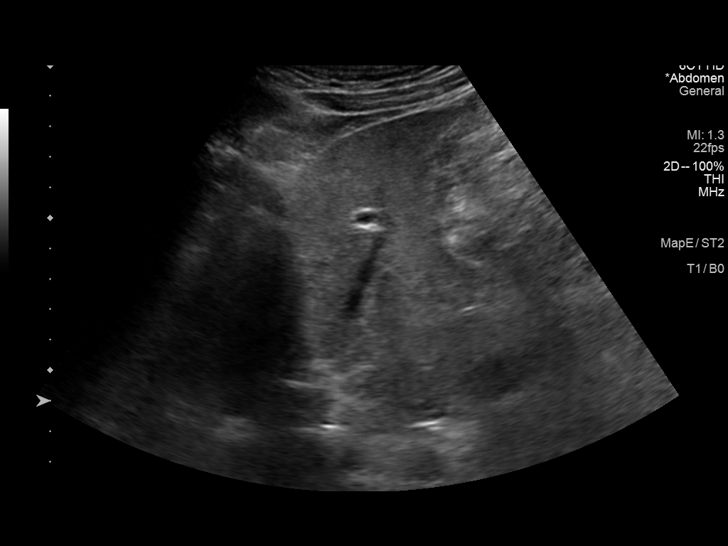
[im 15/40]
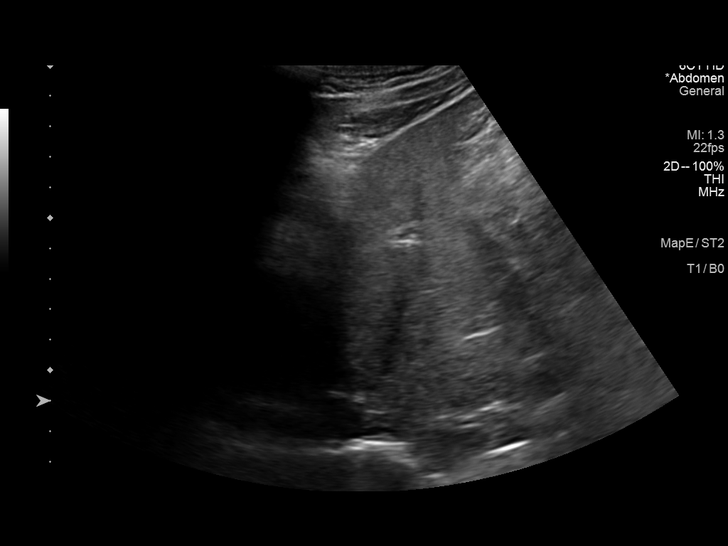
[im 18/40]
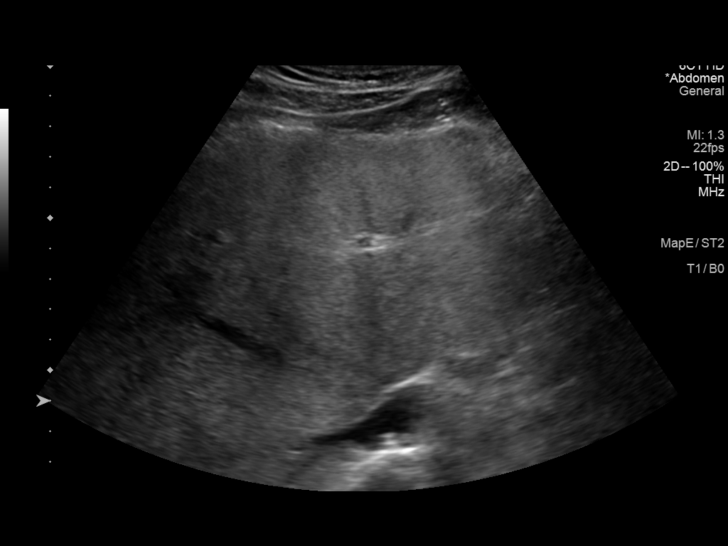
[im 22/40]
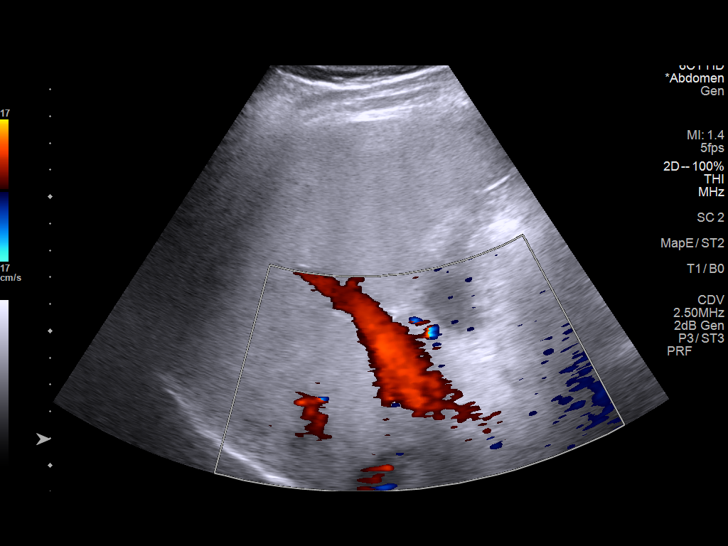
[im 25/40]
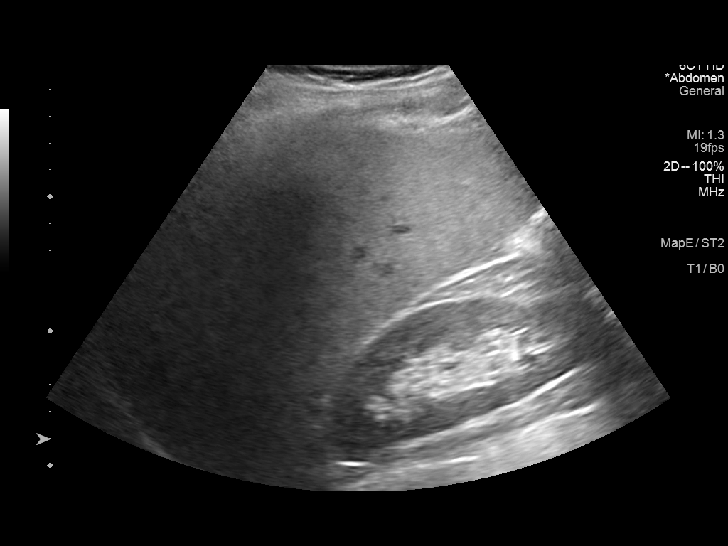
[im 27/40]
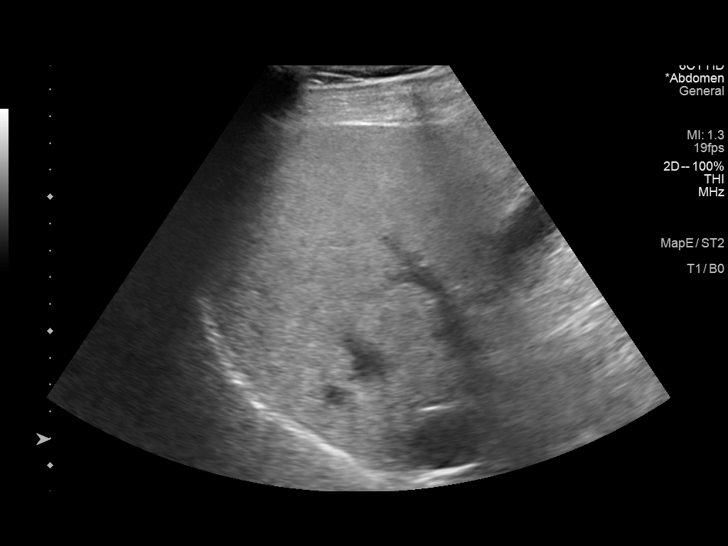
[im 30/40]
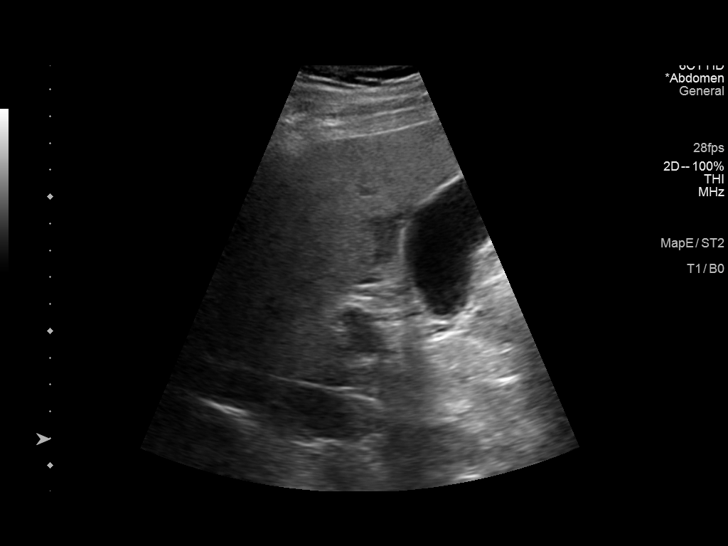
[im 33/40]
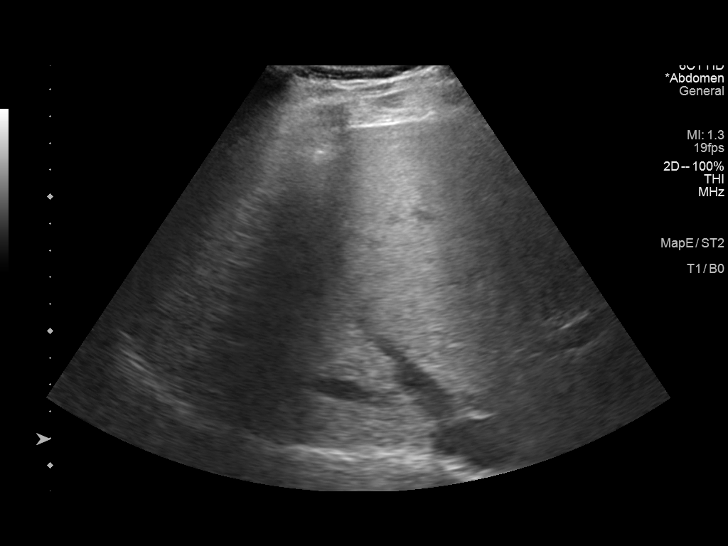
[im 36/40]
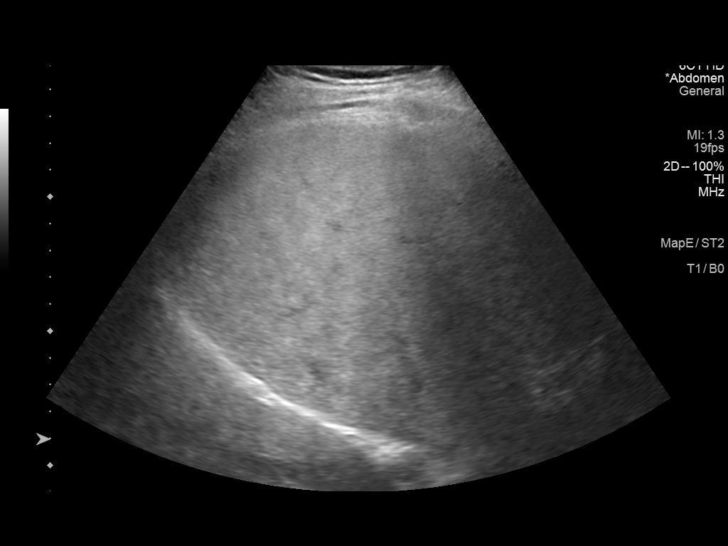
[im 40/40]
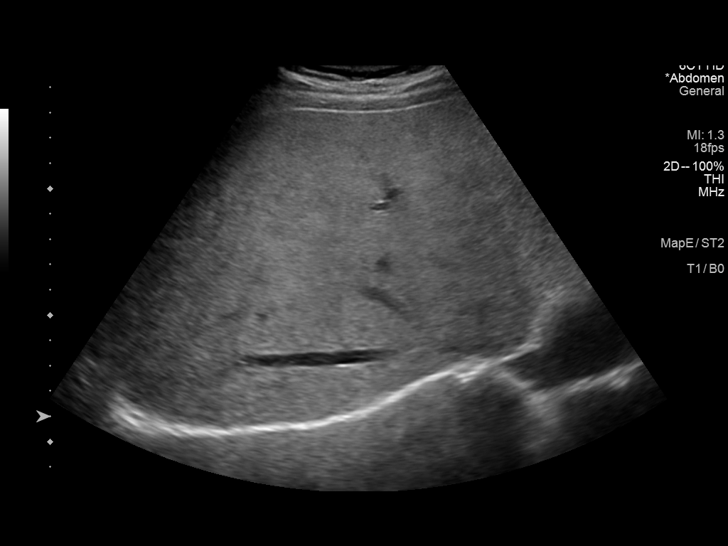

[14 of 25 positions shown; findings below may reference images not displayed]

FINDINGS: Gallbladder:

No gallstones or wall thickening visualized. No sonographic Murphy
sign noted by sonographer.

Common bile duct:

Diameter: 2 mm

Liver:

Enlarged with inhomogeneous increased echogenicity of the liver
parenchyma. Areas of likely fatty sparing adjacent to the
gallbladder. Portal vein is patent on color Doppler imaging with
normal direction of blood flow towards the liver.

Other: None.
IMPRESSION: Hepatomegaly with evidence of hepatic steatosis.

## 2023-06-12 ENCOUNTER — Encounter (INDEPENDENT_AMBULATORY_CARE_PROVIDER_SITE_OTHER): Payer: Self-pay

## 2023-07-01 ENCOUNTER — Ambulatory Visit: Payer: 59 | Admitting: Family Medicine
